# Patient Record
Sex: Male | Born: 1955 | Race: White | Hispanic: No | Marital: Married | Smoking: Never smoker
Health system: Southern US, Community
[De-identification: ages and names within clinical notes are randomized; demographics above are authoritative.]

## PROBLEM LIST (undated history)

## (undated) DIAGNOSIS — E291 Testicular hypofunction: Secondary | ICD-10-CM

## (undated) HISTORY — DX: Testicular hypofunction: E29.1

## (undated) HISTORY — PX: PROSTATE BIOPSY: SHX241

## (undated) HISTORY — PX: LAPAROSCOPIC APPENDECTOMY: SUR753

## (undated) HISTORY — PX: TONSILLECTOMY AND ADENOIDECTOMY: SUR1326

---

## 1999-04-10 ENCOUNTER — Emergency Department (HOSPITAL_COMMUNITY): Admission: EM | Admit: 1999-04-10 | Discharge: 1999-04-10 | Payer: Self-pay | Admitting: Emergency Medicine

## 1999-04-18 ENCOUNTER — Encounter: Payer: Self-pay | Admitting: Family Medicine

## 1999-04-18 ENCOUNTER — Ambulatory Visit (HOSPITAL_COMMUNITY): Admission: RE | Admit: 1999-04-18 | Discharge: 1999-04-18 | Payer: Self-pay | Admitting: Family Medicine

## 1999-09-05 ENCOUNTER — Ambulatory Visit (HOSPITAL_COMMUNITY): Admission: RE | Admit: 1999-09-05 | Discharge: 1999-09-05 | Payer: Self-pay | Admitting: Gastroenterology

## 2000-05-24 ENCOUNTER — Emergency Department (HOSPITAL_COMMUNITY): Admission: EM | Admit: 2000-05-24 | Discharge: 2000-05-24 | Payer: Self-pay | Admitting: Emergency Medicine

## 2000-11-07 ENCOUNTER — Ambulatory Visit (HOSPITAL_COMMUNITY): Admission: RE | Admit: 2000-11-07 | Discharge: 2000-11-07 | Payer: Self-pay | Admitting: Family Medicine

## 2004-02-04 ENCOUNTER — Ambulatory Visit (HOSPITAL_COMMUNITY): Admission: RE | Admit: 2004-02-04 | Discharge: 2004-02-04 | Payer: Self-pay | Admitting: Family Medicine

## 2004-12-28 ENCOUNTER — Encounter: Admission: RE | Admit: 2004-12-28 | Discharge: 2004-12-28 | Payer: Self-pay | Admitting: Family Medicine

## 2009-08-29 ENCOUNTER — Encounter: Admission: RE | Admit: 2009-08-29 | Discharge: 2009-08-29 | Payer: Self-pay | Admitting: Family Medicine

## 2010-07-27 ENCOUNTER — Ambulatory Visit
Admission: RE | Admit: 2010-07-27 | Discharge: 2010-07-27 | Disposition: A | Discharge status: Home / Self Care | Payer: BC Managed Care – PPO | Source: Ambulatory Visit | Attending: Family Medicine | Admitting: Family Medicine

## 2010-07-27 ENCOUNTER — Other Ambulatory Visit: Payer: Self-pay | Admitting: Family Medicine

## 2010-07-27 DIAGNOSIS — IMO0001 Reserved for inherently not codable concepts without codable children: Secondary | ICD-10-CM

## 2011-05-16 ENCOUNTER — Ambulatory Visit (INDEPENDENT_AMBULATORY_CARE_PROVIDER_SITE_OTHER): Payer: BC Managed Care – PPO | Admitting: Family Medicine

## 2011-05-16 VITALS — BP 147/79 | HR 73 | Temp 98.7°F | Resp 16 | Ht 66.5 in | Wt 177.0 lb

## 2011-05-16 DIAGNOSIS — M25579 Pain in unspecified ankle and joints of unspecified foot: Secondary | ICD-10-CM

## 2011-05-16 NOTE — Progress Notes (Signed)
  Subjective:    Patient ID: FAHD GALEA, male    DOB: 10-26-1955, 56 y.o.   MRN: [id number]  HPI 56 yo male with ankle injury today. Was workin gin the yard and rolled ankle.  Was wearing high-ankle work boots.  Able to walk but does hurt some.  History of multiple previous sprains.  Does have brace at home but was not wearing it.    Review of Systems Negative except as per HPI     Objective:   Physical Exam  Constitutional: He appears well-developed.  Pulmonary/Chest: Effort normal.  Neurological: He is alert.    No tenderness over right lateral or medial malleolus.  Pain over ATF ligament.  Good ROM.  Mild swelling over ATF.       Assessment & Plan:  Ankle sprain.  NO xray indicated today.  Use brace for several weeks.  Ice, ibuprofen, ABC exercises.  RTC if not healing as expected or if worsens.

## 2012-04-26 ENCOUNTER — Ambulatory Visit (INDEPENDENT_AMBULATORY_CARE_PROVIDER_SITE_OTHER): Payer: BC Managed Care – PPO | Admitting: Family Medicine

## 2012-04-26 VITALS — BP 118/76 | HR 68 | Temp 98.6°F | Resp 16 | Ht 66.5 in | Wt 176.0 lb

## 2012-04-26 DIAGNOSIS — M25579 Pain in unspecified ankle and joints of unspecified foot: Secondary | ICD-10-CM

## 2012-04-26 MED ORDER — SULFAMETHOXAZOLE-TRIMETHOPRIM 800-160 MG PO TABS: 2.0000 | ORAL_TABLET | Freq: Two times a day (BID) | ORAL | Status: DC

## 2012-04-26 NOTE — Progress Notes (Signed)
  Subjective:    Patient ID: Anthony Schneider, male    DOB: 02-16-1955, 57 y.o.   MRN: [id number]  HPI    Review of Systems     Objective:   Physical Exam  Procedure: consent obtained.  L 5th digit betadine and alcohol prep. 1%plain lidocaine, 0.5 cc injected locally.  #11 blade for "J" incision.  No purulence. Bandaged.      Assessment & Plan:  Salt water soaks tid. Start antibiotics and wound care discussed.

## 2012-04-26 NOTE — Progress Notes (Signed)
  Subjective:    Patient ID: Anthony Schneider, male    DOB: [birth year], 56 y.o.   MRN: [id number]  HPI  Stubbed/broked his toe around xmas, then developed bump and became pink - saw the doctor and was treated with 10d of keflex which improved it but the bump remained and now for the past 3d has started to get very tender, swollen, and red again.  Not draining anything but painful.  Had been doing neosporin which helped a little.  Past Medical History  Diagnosis Date  . Hypogonadism male    Current Outpatient Prescriptions on File Prior to Visit  Medication Sig Dispense Refill  . citalopram (CELEXA) 20 MG tablet Take 20 mg by mouth daily.       No current facility-administered medications on file prior to visit.   No Known Allergies   Review of Systems  Constitutional: Negative for fever, chills, diaphoresis, activity change and appetite change.  Musculoskeletal: Positive for joint swelling and arthralgias. Negative for myalgias, back pain and gait problem.  Skin: Positive for color change. Negative for pallor and wound.  Neurological: Negative for weakness and numbness.  Hematological: Negative for adenopathy. Does not bruise/bleed easily.       BP 118/76  Pulse 68  Temp(Src) 98.6 F (37 C) (Oral)  Resp 16  Ht 5' 6.5" (1.689 m)  Wt 176 lb (79.833 kg)  BMI 27.98 kg/m2  SpO2 96% Objective:   Physical Exam  Constitutional: He is oriented to person, place, and time. He appears well-developed and well-nourished. No distress.  HENT:  Head: Normocephalic and atraumatic.  Eyes: No scleral icterus.  Pulmonary/Chest: Effort normal.  Neurological: He is alert and oriented to person, place, and time.  Skin: Skin is warm and dry. He is not diaphoretic.  Left 5th toe with blanchable erythema over distal phalanx with localized induration with small amount of fluctuance vs edema on dorsal medial aspect near nail bed.  Psychiatric: He has a normal mood and affect. His behavior is normal.       Assessment & Plan:  Paronychia of fifth toe of left foot  Failed oral antibiotic therapy alone so will need drainage today.  Asked McClung, PA to examine toe and drain.  Try course of bactrim with frequent soaks and top antibiotics. Meds ordered this encounter  Medications  . clomiPHENE (CLOMID) 50 MG tablet    Sig: Take 50 mg by mouth daily.  Marland Kitchen sulfamethoxazole-trimethoprim (BACTRIM DS,SEPTRA DS) 800-160 MG per tablet    Sig: Take 2 tablets by mouth 2 (two) times daily.    Dispense:  40 tablet    Refill:  0

## 2012-04-26 NOTE — Patient Instructions (Addendum)
Paronychia  Paronychia is an inflammatory reaction involving the folds of the skin surrounding the fingernail. This is commonly caused by an infection in the skin around a nail. The most common cause of paronychia is frequent wetting of the hands (as seen with bartenders, food servers, nurses or others who wet their hands). This makes the skin around the fingernail susceptible to infection by bacteria (germs) or fungus. Other predisposing factors are:   Aggressive manicuring.   Nail biting.   Thumb sucking.  The most common cause is a staphylococcal (a type of germ) infection, or a fungal (Candida) infection. When caused by a germ, it usually comes on suddenly with redness, swelling, pus and is often painful. It may get under the nail and form an abscess (collection of pus), or form an abscess around the nail. If the nail itself is infected with a fungus, the treatment is usually prolonged and may require oral medicine for up to one year. Your caregiver will determine the length of time treatment is required. The paronychia caused by bacteria (germs) may largely be avoided by not pulling on hangnails or picking at cuticles. When the infection occurs at the tips of the finger it is called felon. When the cause of paronychia is from the herpes simplex virus (HSV) it is called herpetic whitlow.  TREATMENT   When an abscess is present treatment is often incision and drainage. This means that the abscess must be cut open so the pus can get out. When this is done, the following home care instructions should be followed.  HOME CARE INSTRUCTIONS    It is important to keep the affected fingers very dry. Rubber or plastic gloves over cotton gloves should be used whenever the hand must be placed in water.   Keep wound clean, dry and dressed as suggested by your caregiver between warm soaks or warm compresses.   Soak in warm water for fifteen to twenty minutes three to four times per day for bacterial infections. Fungal  infections are very difficult to treat, so often require treatment for long periods of time.   For bacterial (germ) infections take antibiotics (medicine which kill germs) as directed and finish the prescription, even if the problem appears to be solved before the medicine is gone.   Only take over-the-counter or prescription medicines for pain, discomfort, or fever as directed by your caregiver.  SEEK IMMEDIATE MEDICAL CARE IF:   You have redness, swelling, or increasing pain in the wound.   You notice pus coming from the wound.   You have a fever.   You notice a bad smell coming from the wound or dressing.  Document Released: 07/24/2000 Document Revised: 04/22/2011 Document Reviewed: 03/25/2008  [employer] Patient Information 2013 [employer].

## 2012-05-04 ENCOUNTER — Other Ambulatory Visit: Payer: Self-pay

## 2012-05-04 ENCOUNTER — Ambulatory Visit
Admission: RE | Admit: 2012-05-04 | Discharge: 2012-05-04 | Disposition: A | Discharge status: Home / Self Care | Payer: BC Managed Care – PPO | Source: Ambulatory Visit

## 2012-05-04 DIAGNOSIS — M79675 Pain in left toe(s): Secondary | ICD-10-CM

## 2012-07-03 ENCOUNTER — Other Ambulatory Visit: Payer: Self-pay | Admitting: Family Medicine

## 2012-07-03 ENCOUNTER — Ambulatory Visit
Admission: RE | Admit: 2012-07-03 | Discharge: 2012-07-03 | Disposition: A | Discharge status: Home / Self Care | Payer: BC Managed Care – PPO | Source: Ambulatory Visit | Attending: Family Medicine | Admitting: Family Medicine

## 2012-07-03 DIAGNOSIS — M545 Low back pain: Secondary | ICD-10-CM

## 2013-04-01 ENCOUNTER — Emergency Department (HOSPITAL_COMMUNITY)
Admission: EM | Admit: 2013-04-01 | Discharge: 2013-04-02 | Disposition: A | Discharge status: Home / Self Care | Payer: BC Managed Care – PPO | Attending: Urology | Admitting: Urology

## 2013-04-01 ENCOUNTER — Encounter (HOSPITAL_COMMUNITY): Payer: Self-pay | Admitting: Emergency Medicine

## 2013-04-01 DIAGNOSIS — Z8639 Personal history of other endocrine, nutritional and metabolic disease: Secondary | ICD-10-CM | POA: Insufficient documentation

## 2013-04-01 DIAGNOSIS — R110 Nausea: Secondary | ICD-10-CM | POA: Insufficient documentation

## 2013-04-01 DIAGNOSIS — Z79899 Other long term (current) drug therapy: Secondary | ICD-10-CM | POA: Insufficient documentation

## 2013-04-01 DIAGNOSIS — N390 Urinary tract infection, site not specified: Secondary | ICD-10-CM

## 2013-04-01 DIAGNOSIS — Z792 Long term (current) use of antibiotics: Secondary | ICD-10-CM | POA: Insufficient documentation

## 2013-04-01 DIAGNOSIS — A419 Sepsis, unspecified organism: Secondary | ICD-10-CM | POA: Diagnosis present

## 2013-04-01 DIAGNOSIS — N419 Inflammatory disease of prostate, unspecified: Secondary | ICD-10-CM | POA: Insufficient documentation

## 2013-04-01 DIAGNOSIS — Z862 Personal history of diseases of the blood and blood-forming organs and certain disorders involving the immune mechanism: Secondary | ICD-10-CM | POA: Insufficient documentation

## 2013-04-01 DIAGNOSIS — Z9889 Other specified postprocedural states: Secondary | ICD-10-CM | POA: Insufficient documentation

## 2013-04-01 NOTE — ED Notes (Signed)
Pt reports onset of fever today of 100.5 at home approx 95min pta w/ chills that began approx 2hrs ago - pt states he awoke this a.m. Not feeling well. Pt had a prostate biopsy performed on Tuesday - pt has also been experiencing urinary urgency and frequency. Pt also reports lower back pain, which pt has a hx of however pt reports increase in lower back pain recently. Pt last took tylenol approx 1600 today.

## 2013-04-02 ENCOUNTER — Encounter (HOSPITAL_COMMUNITY): Payer: Self-pay | Admitting: *Deleted

## 2013-04-02 ENCOUNTER — Inpatient Hospital Stay (HOSPITAL_COMMUNITY)
Admission: AD | Admit: 2013-04-02 | Discharge: 2013-04-05 | DRG: 872 | Disposition: A | Discharge status: Home / Self Care | Payer: BC Managed Care – PPO | Source: Ambulatory Visit | Attending: Urology | Admitting: Urology

## 2013-04-02 ENCOUNTER — Emergency Department (HOSPITAL_COMMUNITY): Payer: BC Managed Care – PPO

## 2013-04-02 DIAGNOSIS — R61 Generalized hyperhidrosis: Secondary | ICD-10-CM | POA: Diagnosis present

## 2013-04-02 DIAGNOSIS — M545 Low back pain, unspecified: Secondary | ICD-10-CM | POA: Diagnosis present

## 2013-04-02 DIAGNOSIS — R310 Gross hematuria: Secondary | ICD-10-CM | POA: Diagnosis present

## 2013-04-02 DIAGNOSIS — N390 Urinary tract infection, site not specified: Secondary | ICD-10-CM | POA: Diagnosis present

## 2013-04-02 DIAGNOSIS — Z9889 Other specified postprocedural states: Secondary | ICD-10-CM

## 2013-04-02 DIAGNOSIS — R3915 Urgency of urination: Secondary | ICD-10-CM | POA: Diagnosis present

## 2013-04-02 DIAGNOSIS — A419 Sepsis, unspecified organism: Principal | ICD-10-CM | POA: Diagnosis present

## 2013-04-02 DIAGNOSIS — R51 Headache: Secondary | ICD-10-CM | POA: Diagnosis present

## 2013-04-02 DIAGNOSIS — R3911 Hesitancy of micturition: Secondary | ICD-10-CM | POA: Diagnosis present

## 2013-04-02 LAB — URINALYSIS, ROUTINE W REFLEX MICROSCOPIC
Bilirubin Urine: NEGATIVE
Glucose, UA: NEGATIVE mg/dL
Glucose, UA: NEGATIVE mg/dL
Ketones, ur: 15 mg/dL — AB
Ketones, ur: NEGATIVE mg/dL
NITRITE: NEGATIVE
NITRITE: POSITIVE — AB
PH: 6.5 (ref 5.0–8.0)
Protein, ur: 100 mg/dL — AB
Protein, ur: NEGATIVE mg/dL
SPECIFIC GRAVITY, URINE: 1.014 (ref 1.005–1.030)
SPECIFIC GRAVITY, URINE: 1.03 (ref 1.005–1.030)
UROBILINOGEN UA: 1 mg/dL (ref 0.0–1.0)
UROBILINOGEN UA: 1 mg/dL (ref 0.0–1.0)
pH: 6.5 (ref 5.0–8.0)

## 2013-04-02 LAB — COMPREHENSIVE METABOLIC PANEL
ALT: 42 U/L (ref 0–53)
AST: 29 U/L (ref 0–37)
Albumin: 3.5 g/dL (ref 3.5–5.2)
Alkaline Phosphatase: 61 U/L (ref 39–117)
BUN: 17 mg/dL (ref 6–23)
CO2: 22 meq/L (ref 19–32)
Calcium: 8.8 mg/dL (ref 8.4–10.5)
Chloride: 96 mEq/L (ref 96–112)
Creatinine, Ser: 1.02 mg/dL (ref 0.50–1.35)
GFR calc non Af Amer: 80 mL/min — ABNORMAL LOW (ref >90)
Glucose, Bld: 133 mg/dL — ABNORMAL HIGH (ref 70–99)
Potassium: 3.8 mEq/L (ref 3.7–5.3)
SODIUM: 133 meq/L — AB (ref 137–147)
TOTAL PROTEIN: 6.7 g/dL (ref 6.0–8.3)
Total Bilirubin: 2.6 mg/dL — ABNORMAL HIGH (ref 0.3–1.2)

## 2013-04-02 LAB — CBC WITH DIFFERENTIAL/PLATELET
BASOS PCT: 0 % (ref 0–1)
Basophils Absolute: 0 10*3/uL (ref 0.0–0.1)
Eosinophils Absolute: 0 10*3/uL (ref 0.0–0.7)
Eosinophils Relative: 0 % (ref 0–5)
HEMATOCRIT: 41 % (ref 39.0–52.0)
Hemoglobin: 14.5 g/dL (ref 13.0–17.0)
LYMPHS PCT: 1 % — AB (ref 12–46)
Lymphs Abs: 0.2 10*3/uL — ABNORMAL LOW (ref 0.7–4.0)
MCH: 31.3 pg (ref 26.0–34.0)
MCHC: 35.4 g/dL (ref 30.0–36.0)
MCV: 88.6 fL (ref 78.0–100.0)
Monocytes Absolute: 0.5 10*3/uL (ref 0.1–1.0)
Monocytes Relative: 4 % (ref 3–12)
NEUTROS ABS: 13.4 10*3/uL — AB (ref 1.7–7.7)
Neutrophils Relative %: 95 % — ABNORMAL HIGH (ref 43–77)
PLATELETS: 143 10*3/uL — AB (ref 150–400)
RBC: 4.63 MIL/uL (ref 4.22–5.81)
RDW: 12.9 % (ref 11.5–15.5)
WBC: 14.1 10*3/uL — AB (ref 4.0–10.5)

## 2013-04-02 LAB — URINE MICROSCOPIC-ADD ON

## 2013-04-02 MED ORDER — ONDANSETRON HCL 4 MG/2ML IJ SOLN
4.0000 mg | Freq: Once | INTRAMUSCULAR | Status: AC
  Administered 2013-04-02: 4 mg via INTRAVENOUS
  Filled 2013-04-02: qty 2

## 2013-04-02 MED ORDER — ZOLPIDEM TARTRATE 5 MG PO TABS
5.0000 mg | ORAL_TABLET | Freq: Every evening | ORAL | Status: DC | PRN
  Administered 2013-04-04 (×2): 5 mg via ORAL
  Filled 2013-04-02 (×2): qty 1

## 2013-04-02 MED ORDER — DEXTROSE-NACL 5-0.45 % IV SOLN: INTRAVENOUS | Status: AC

## 2013-04-02 MED ORDER — PIPERACILLIN-TAZOBACTAM 3.375 G IVPB
3.3750 g | Freq: Three times a day (TID) | INTRAVENOUS | Status: DC
  Administered 2013-04-02 – 2013-04-05 (×9): 3.375 g via INTRAVENOUS
  Filled 2013-04-02 (×10): qty 50

## 2013-04-02 MED ORDER — DEXTROSE-NACL 5-0.45 % IV SOLN
INTRAVENOUS | Status: DC
  Administered 2013-04-02 – 2013-04-04 (×5): via INTRAVENOUS

## 2013-04-02 MED ORDER — ACETAMINOPHEN 325 MG PO TABS: 650.0000 mg | ORAL_TABLET | ORAL | Status: DC | PRN

## 2013-04-02 MED ORDER — ZOLPIDEM TARTRATE 5 MG PO TABS: 5.0000 mg | ORAL_TABLET | Freq: Every evening | ORAL | Status: DC | PRN

## 2013-04-02 MED ORDER — IBUPROFEN 800 MG PO TABS
800.0000 mg | ORAL_TABLET | Freq: Once | ORAL | Status: AC
  Administered 2013-04-02: 800 mg via ORAL
  Filled 2013-04-02: qty 1

## 2013-04-02 MED ORDER — DOXYCYCLINE HYCLATE 100 MG PO CAPS: 100.0000 mg | ORAL_CAPSULE | Freq: Two times a day (BID) | ORAL | Status: DC

## 2013-04-02 MED ORDER — CVS PROBIOTIC (LACTOBACILLUS) PO CAPS: 1.0000 | ORAL_CAPSULE | Freq: Every day | ORAL | Status: AC

## 2013-04-02 MED ORDER — DEXTROSE 5 % IV SOLN
2.0000 g | Freq: Once | INTRAVENOUS | Status: AC
  Administered 2013-04-02: 2 g via INTRAVENOUS
  Filled 2013-04-02: qty 2

## 2013-04-02 MED ORDER — MORPHINE SULFATE 4 MG/ML IJ SOLN
4.0000 mg | Freq: Once | INTRAMUSCULAR | Status: AC
  Administered 2013-04-02: 4 mg via INTRAVENOUS
  Filled 2013-04-02: qty 1

## 2013-04-02 MED ORDER — ACETAMINOPHEN 325 MG PO TABS
650.0000 mg | ORAL_TABLET | ORAL | Status: DC | PRN
  Administered 2013-04-03 – 2013-04-04 (×2): 650 mg via ORAL
  Filled 2013-04-02 (×2): qty 2

## 2013-04-02 MED ORDER — ONDANSETRON 4 MG PO TBDP: 4.0000 mg | ORAL_TABLET | Freq: Three times a day (TID) | ORAL | Status: AC | PRN

## 2013-04-02 MED ORDER — DEXTROSE 5 % IV SOLN
7.0000 mg/kg | INTRAVENOUS | Status: DC
  Administered 2013-04-02 – 2013-04-04 (×3): 560 mg via INTRAVENOUS
  Filled 2013-04-02 (×4): qty 14

## 2013-04-02 MED ORDER — CEPHALEXIN 500 MG PO CAPS: 500.0000 mg | ORAL_CAPSULE | Freq: Four times a day (QID) | ORAL | Status: DC

## 2013-04-02 MED ORDER — ACETAMINOPHEN 325 MG PO TABS
650.0000 mg | ORAL_TABLET | Freq: Once | ORAL | Status: AC
  Administered 2013-04-02: 650 mg via ORAL
  Filled 2013-04-02: qty 2

## 2013-04-02 MED ORDER — PIPERACILLIN-TAZOBACTAM 3.375 G IVPB: 3.3750 g | Freq: Three times a day (TID) | INTRAVENOUS | Status: DC

## 2013-04-02 MED ORDER — OXYCODONE-ACETAMINOPHEN 5-325 MG PO TABS: 1.0000 | ORAL_TABLET | Freq: Four times a day (QID) | ORAL | Status: DC | PRN

## 2013-04-02 MED ORDER — ONDANSETRON HCL 4 MG/2ML IJ SOLN
4.0000 mg | INTRAMUSCULAR | Status: DC | PRN
Start: 2013-04-02 — End: 2013-04-02

## 2013-04-02 MED ORDER — HYDROCODONE-ACETAMINOPHEN 5-325 MG PO TABS
1.0000 | ORAL_TABLET | ORAL | Status: DC | PRN
  Administered 2013-04-02 (×2): 2 via ORAL
  Administered 2013-04-03 (×2): 1 via ORAL
  Administered 2013-04-03: 2 via ORAL
  Administered 2013-04-04 (×3): 1 via ORAL
  Filled 2013-04-02 (×2): qty 1
  Filled 2013-04-02 (×2): qty 2
  Filled 2013-04-02 (×2): qty 1
  Filled 2013-04-02: qty 2
  Filled 2013-04-02: qty 1
  Filled 2013-04-02: qty 2

## 2013-04-02 MED ORDER — ONDANSETRON HCL 4 MG/2ML IJ SOLN
4.0000 mg | INTRAMUSCULAR | Status: DC | PRN
Start: 2013-04-02 — End: 2013-04-05
  Administered 2013-04-04: 4 mg via INTRAVENOUS
  Filled 2013-04-02: qty 2

## 2013-04-02 MED ORDER — SODIUM CHLORIDE 0.9 % IV BOLUS (SEPSIS)
1000.0000 mL | Freq: Once | INTRAVENOUS | Status: AC
  Administered 2013-04-02: 1000 mL via INTRAVENOUS

## 2013-04-02 MED ORDER — HYDROCODONE-ACETAMINOPHEN 5-325 MG PO TABS: 1.0000 | ORAL_TABLET | ORAL | Status: DC | PRN

## 2013-04-02 NOTE — Progress Notes (Signed)
Pt arrived as direct admit to floor from home. Pt oriented to callbell and environment. VSS w/ temp of 100.6 and HR 110. Dr Janice Norrie aware of pt's arrival and entering orders in Epic now.

## 2013-04-02 NOTE — H&P (Signed)
Anthony Schneider is an 58 y.o. male.    HPI: Mr Schollmeyer had a prostate biopsy on 2/17.  He was doing well till yesterday when he started having chills, fever, low back pain.  His temperature was 100.5.  He was seen in the ER and was given Rocephin.  He was discharged home on doxycycline, Keflex and ibuprofen.  He was doing well until this afternoon when his temperature spiked up to 103.  He called the office and I advised him to come to the hospital for admission and further evaluation and IV antibiotics.  Past Medical History  Diagnosis Date  . Hypogonadism male     Past Surgical History  Procedure Laterality Date  . Tonsillectomy and adenoidectomy    . Prostate biopsy      Medications Prior to Admission  Medication Sig Dispense Refill  . acetaminophen (TYLENOL) 500 MG tablet Take 500 mg by mouth every 6 (six) hours as needed (pain.).       Marland Kitchen cephALEXin (KEFLEX) 500 MG capsule Take 1 capsule (500 mg total) by mouth 4 (four) times daily.  40 capsule  0  . citalopram (CELEXA) 20 MG tablet Take 20 mg by mouth daily.      Marland Kitchen doxycycline (VIBRAMYCIN) 100 MG capsule Take 1 capsule (100 mg total) by mouth 2 (two) times daily.  20 capsule  0  . Lactobacillus Rhamnosus, GG, ([employer] PROBIOTIC, LACTOBACILLUS,) CAPS Take 1 tablet by mouth daily.  30 capsule  0  . oxyCODONE-acetaminophen (PERCOCET) 5-325 MG per tablet Take 1-2 tablets by mouth every 6 (six) hours as needed for severe pain.  20 tablet  0  . levofloxacin (LEVAQUIN) 500 MG tablet Take 500 mg by mouth daily.      . ondansetron (ZOFRAN ODT) 4 MG disintegrating tablet Take 1 tablet (4 mg total) by mouth every 8 (eight) hours as needed for nausea or vomiting.  10 tablet  0    Allergies: No Known Allergies  History reviewed. No pertinent family history.  Social History:  reports that he has never smoked. He has never used smokeless tobacco. He reports that he drinks alcohol. He reports that he does not use illicit drugs.  Review of  Systems: Pertinent items are noted in HPI. A comprehensive review of systems was negative except for: frequency, urgency, dysuria, fever, chills, anorexia.  Results for orders placed during the hospital encounter of 04/02/13 (from the past 48 hour(s))  URINALYSIS, ROUTINE W REFLEX MICROSCOPIC     Status: Abnormal   Collection Time    04/02/13  4:27 PM      Result Value Ref Range   Color, Urine ORANGE (*) YELLOW   Comment: BIOCHEMICALS MAY BE AFFECTED BY COLOR   APPearance CLOUDY (*) CLEAR   Specific Gravity, Urine 1.030  1.005 - 1.030   pH 6.5  5.0 - 8.0   Glucose, UA NEGATIVE  NEGATIVE mg/dL   Hgb urine dipstick LARGE (*) NEGATIVE   Bilirubin Urine SMALL (*) NEGATIVE   Ketones, ur 15 (*) NEGATIVE mg/dL   Protein, ur 100 (*) NEGATIVE mg/dL   Urobilinogen, UA 1.0  0.0 - 1.0 mg/dL   Nitrite POSITIVE (*) NEGATIVE   Leukocytes, UA MODERATE (*) NEGATIVE  URINE MICROSCOPIC-ADD ON     Status: Abnormal   Collection Time    04/02/13  4:27 PM      Result Value Ref Range   Squamous Epithelial / LPF RARE  RARE   WBC, UA 11-20  <3 WBC/hpf  RBC / HPF 7-10  <3 RBC/hpf   Bacteria, UA FEW (*) RARE   Casts HYALINE CASTS (*) NEGATIVE  CBC WITH DIFFERENTIAL     Status: Abnormal   Collection Time    04/02/13  4:40 PM      Result Value Ref Range   WBC 14.1 (*) 4.0 - 10.5 K/uL   RBC 4.63  4.22 - 5.81 MIL/uL   Hemoglobin 14.5  13.0 - 17.0 g/dL   HCT 41.0  39.0 - 52.0 %   MCV 88.6  78.0 - 100.0 fL   MCH 31.3  26.0 - 34.0 pg   MCHC 35.4  30.0 - 36.0 g/dL   RDW 12.9  11.5 - 15.5 %   Platelets 143 (*) 150 - 400 K/uL   Neutrophils Relative % 95 (*) 43 - 77 %   Neutro Abs 13.4 (*) 1.7 - 7.7 K/uL   Lymphocytes Relative 1 (*) 12 - 46 %   Lymphs Abs 0.2 (*) 0.7 - 4.0 K/uL   Monocytes Relative 4  3 - 12 %   Monocytes Absolute 0.5  0.1 - 1.0 K/uL   Eosinophils Relative 0  0 - 5 %   Eosinophils Absolute 0.0  0.0 - 0.7 K/uL   Basophils Relative 0  0 - 1 %   Basophils Absolute 0.0  0.0 - 0.1 K/uL     Dg Chest 2 View  04/02/2013   CLINICAL DATA:  Fever, flu-like symptoms.  EXAM: CHEST  2 VIEW  COMPARISON:  None.  FINDINGS: Heart is normal size. There is prominence in the left hilum and AP window region. Cannot exclude adenopathy. No confluent airspace on the right. Minimal predominately linear densities in the left base, likely atelectasis. No effusions. No acute bony abnormality.  IMPRESSION: Fullness in the left hilum and AP window region. Cannot exclude adenopathy. This could be further evaluated with chest CT with IV contrast.  Minimal left base density, likely atelectasis.   Electronically Signed   By: Rolm Baptise M.D.   On: 04/02/2013 00:35    Temp:  [98.9 F (37.2 C)-102.9 F (39.4 C)] 100.6 F (38.1 C) (02/20 1521) Pulse Rate:  [80-110] 110 (02/20 1521) Resp:  [16-20] 20 (02/20 1521) BP: (92-161)/(51-88) 133/81 mmHg (02/20 1521) SpO2:  [92 %-100 %] 96 % (02/20 1521) Weight:  [77.111 kg (170 lb)-80.74 kg (178 lb)] 80.74 kg (178 lb) (02/20 1521)  Physical Exam: General appearance: alert and appears stated age Head: Normocephalic, without obvious abnormality, atraumatic Eyes: conjunctivae/corneas clear. EOM's intact.  Oropharynx: moist mucous membranes Neck: supple, symmetrical, trachea midline Resp: normal respiratory effort Cardio: regular rate and rhythm Back: symmetric, no curvature. ROM normal. No CVA tenderness. GI: soft, non-tender; bowel sounds normal; no masses,  no organomegaly Male genitalia: penis: normal male phallus with no lesions or discharge.Testes: bilaterally descended with no masses or tenderness. no hernias Pelvic: deferred Extremities: extremities normal, atraumatic, no cyanosis or edema Skin: Skin color normal. No visible rashes or lesions Neurologic: Grossly normal  Assessment/Plan Urosepsis. S/P prostate biopsy  Plan: Admit for IV antibiotics after blood and urine cultures.  Hanley Ben 04/02/2013, 5:47 PM

## 2013-04-02 NOTE — ED Provider Notes (Signed)
CSN: [id number]     Arrival date & time 04/01/13  2335 History   First MD Initiated Contact with Patient 04/02/13 0141     Chief Complaint  Patient presents with  . Fever     (Consider location/radiation/quality/duration/timing/severity/associated sxs/prior Treatment) HPI Comments: The patient is a 58 year old male past medical history significant for hypogonadism presented to the emergency department for fever (TMAX 100.67F), chills, and moderate to severe low back pain with associated urinary urgency that began 30 minutes prior to arrival. Patient states he had a prostate biopsy on Tuesday performed by Dr. Kellie Simmering. Patient states he was given Levaquin for three days starting the day before the procedure. Patient took Tylenol at 4PM today. Patient states he was advised in his discharge instructions to come to the ED for any fevers. Denies any abdominal surgical history.    Past Medical History  Diagnosis Date  . Hypogonadism male    Past Surgical History  Procedure Laterality Date  . Tonsillectomy and adenoidectomy    . Prostate biopsy     History reviewed. No pertinent family history. History  Substance Use Topics  . Smoking status: Never Smoker   . Smokeless tobacco: Not on file  . Alcohol Use: Yes     Comment: occasionally    Review of Systems  Constitutional: Positive for fever.  Gastrointestinal: Positive for nausea. Negative for vomiting, abdominal pain and rectal pain.  Genitourinary: Positive for urgency. Negative for dysuria, frequency, hematuria, decreased urine volume, discharge, penile swelling, scrotal swelling, penile pain and testicular pain.  Musculoskeletal: Positive for back pain.  All other systems reviewed and are negative.      Allergies  Review of patient's allergies indicates no known allergies.  Home Medications   Current Outpatient Rx  Name  Route  Sig  Dispense  Refill  . acetaminophen (TYLENOL) 500 MG tablet   Oral   Take 500 mg by mouth  every 6 (six) hours as needed.         . citalopram (CELEXA) 20 MG tablet   Oral   Take 20 mg by mouth daily.         . cephALEXin (KEFLEX) 500 MG capsule   Oral   Take 1 capsule (500 mg total) by mouth 4 (four) times daily.   40 capsule   0   . doxycycline (VIBRAMYCIN) 100 MG capsule   Oral   Take 1 capsule (100 mg total) by mouth 2 (two) times daily.   20 capsule   0   . Lactobacillus Rhamnosus, GG, ([employer] PROBIOTIC, LACTOBACILLUS,) CAPS   Oral   Take 1 tablet by mouth daily.   30 capsule   0   . levofloxacin (LEVAQUIN) 500 MG tablet   Oral   Take 500 mg by mouth daily.         . ondansetron (ZOFRAN ODT) 4 MG disintegrating tablet   Oral   Take 1 tablet (4 mg total) by mouth every 8 (eight) hours as needed for nausea or vomiting.   10 tablet   0   . oxyCODONE-acetaminophen (PERCOCET) 5-325 MG per tablet   Oral   Take 1-2 tablets by mouth every 6 (six) hours as needed for severe pain.   20 tablet   0    BP 96/52  Pulse 80  Temp(Src) 98.9 F (37.2 C) (Oral)  Resp 16  Ht 5\' 7"  (1.702 m)  Wt 170 lb (77.111 kg)  BMI 26.62 kg/m2  SpO2 95% Physical Exam  Constitutional: He  is oriented to person, place, and time. He appears well-developed and well-nourished. No distress.  HENT:  Head: Normocephalic and atraumatic.  Right Ear: External ear normal.  Left Ear: External ear normal.  Nose: Nose normal.  Mouth/Throat: Oropharynx is clear and moist. No oropharyngeal exudate.  Eyes: Conjunctivae are normal.  Neck: Normal range of motion. Neck supple.  Cardiovascular: Normal rate, regular rhythm and normal heart sounds.   Pulmonary/Chest: Effort normal and breath sounds normal. No respiratory distress. He exhibits no tenderness.  Abdominal: Soft. Bowel sounds are normal. He exhibits no distension. There is no tenderness. There is no rigidity, no rebound, no guarding and no CVA tenderness.  Genitourinary:  DRE was deferred d/t recent biopsy.   Musculoskeletal:  Normal range of motion.       Lumbar back: He exhibits tenderness. He exhibits normal range of motion, no bony tenderness, no swelling, no edema, no deformity, no laceration, no pain, no spasm and normal pulse.       Back:  Neurological: He is alert and oriented to person, place, and time.  Skin: Skin is warm and dry. He is not diaphoretic.  Psychiatric: He has a normal mood and affect.    ED Course  Procedures (including critical care time) Medications  acetaminophen (TYLENOL) tablet 650 mg (650 mg Oral Given 04/02/13 0155)  sodium chloride 0.9 % bolus 1,000 mL (0 mLs Intravenous Stopped 04/02/13 0416)  morphine 4 MG/ML injection 4 mg (4 mg Intravenous Given 04/02/13 0225)  ondansetron (ZOFRAN) injection 4 mg (4 mg Intravenous Given 04/02/13 0225)  cefTRIAXone (ROCEPHIN) 2 g in dextrose 5 % 50 mL IVPB (0 g Intravenous Stopped 04/02/13 0416)  ibuprofen (ADVIL,MOTRIN) tablet 800 mg (800 mg Oral Given 04/02/13 0248)    Labs Review Labs Reviewed  URINALYSIS, ROUTINE W REFLEX MICROSCOPIC - Abnormal; Notable for the following:    Hgb urine dipstick MODERATE (*)    Leukocytes, UA TRACE (*)    All other components within normal limits  URINE MICROSCOPIC-ADD ON   Imaging Review Dg Chest 2 View  04/02/2013   CLINICAL DATA:  Fever, flu-like symptoms.  EXAM: CHEST  2 VIEW  COMPARISON:  None.  FINDINGS: Heart is normal size. There is prominence in the left hilum and AP window region. Cannot exclude adenopathy. No confluent airspace on the right. Minimal predominately linear densities in the left base, likely atelectasis. No effusions. No acute bony abnormality.  IMPRESSION: Fullness in the left hilum and AP window region. Cannot exclude adenopathy. This could be further evaluated with chest CT with IV contrast.  Minimal left base density, likely atelectasis.   Electronically Signed   By: Charlett Nose M.D.   On: 04/02/2013 00:35    EKG Interpretation   None       MDM   Final diagnoses:    Prostatitis    Filed Vitals:   04/02/13 0439  BP: 96/52  Pulse: 80  Temp: 98.9 F (37.2 C)  Resp: 16   Patient initially afebrile in emergency department upon presentation with low back pain, myalgias, chills 2 days status post prostate biopsy. The patient spiked a fever in emergency department. Tylenol and Motrin given with successful reduction of fever. UA reviewed. Hemoglobin likely due to trauma from prostate biopsy. Fever and symptoms likely due to prostatitis from recent prostate biopsy. Will give IV fluids, antibiotics, pain medication and nausea medication in the ER. I have reviewed nursing notes, vital signs, and all appropriate lab and imaging results for this patient. Patient's  symptoms have improved. Will discharge patient home on Keflex and doxycycline for broad-spectrum coverage. We'll also provide pain medication and nausea medication. Advised patient to followup with his urologist today. Patient is agreeable to plan. Patient is stable at time of discharge. Patient d/w with Dr. Randal Buba, agrees with plan.          Harlow Mares, PA-C 04/02/13 [phone]

## 2013-04-02 NOTE — Progress Notes (Signed)
ANTIBIOTIC CONSULT NOTE - INITIAL  Pharmacy Consult for gentamicin Indication: urosepsis  No Known Allergies  Patient Measurements: Height: 5\' 7"  (170.2 cm) Weight: 178 lb (80.74 kg) IBW/kg (Calculated) : 66.1  Vital Signs: Temp: 100.6 F (38.1 C) (02/20 1521) Temp src: Oral (02/20 1521) BP: 133/81 mmHg (02/20 1521) Pulse Rate: 110 (02/20 1521) Intake/Output from previous day:   Intake/Output from this shift: Total I/O In: 290 [P.O.:240; IV Piggyback:50] Out: -   Labs:  Recent Labs  04/02/13 1640  WBC 14.1*  HGB 14.5  PLT 143*  CREATININE 1.02   Estimated Creatinine Clearance: 82.2 ml/min (by C-G formula based on Cr of 1.02). No results found for this basename: VANCOTROUGH, VANCOPEAK, VANCORANDOM, GENTTROUGH, GENTPEAK, GENTRANDOM, TOBRATROUGH, TOBRAPEAK, TOBRARND, AMIKACINPEAK, AMIKACINTROU, AMIKACIN,  in the last 72 hours   Microbiology: No results found for this or any previous visit (from the past 720 hour(s)).  Medical History: Past Medical History  Diagnosis Date  . Hypogonadism male     Medications:  Scheduled:  . piperacillin-tazobactam (ZOSYN)  IV  3.375 g Intravenous 3 times per day   Infusions:  . dextrose 5 % and 0.45% NaCl 125 mL/hr at 04/02/13 1607   Assessment: 58 yo male presented to ER with fever, chills, low back pain and associated urinary urgency. Patient just had prostate biopsy on Tuesday and was given 3 days of Levaquin prior to procedure. Now to start broad spectrum abx's with gentamicin per pharmacy dosing and Zosyn per Md dosing for urosepsis  Goal of Therapy:  Gentamicin adjustment per [place] [employer]  Plan:  1) Gentamicin 7mg /kg (560mg ) IV q24 2) Check random gentamicin level 10 hrs after gentamicin dose started 3) ?need for gentamicin and Zosyn both. Recommend d/c gentamicin if possible   Adrian Saran, PharmD, BCPS Pager [phone] 04/02/2013 6:19 PM

## 2013-04-02 NOTE — ED Notes (Signed)
Pt ambulating independently w/ steady gait on d/c in no acute distress, A&Ox4. D/c instructions reviewed w/ pt and family - pt and family deny any further questions or concerns at present. Rx given x2  

## 2013-04-02 NOTE — Discharge Instructions (Signed)
Please follow up with your urologist to schedule a follow up appointment. Please take your antibiotic until completion. Please take pain medication as prescribed and as needed for pain. Please do not drive on narcotic pain medication. Please take Zofran and Probiotic as prescribed. Please read all discharge instructions and return precautions.    Prostatitis The prostate gland is about the size and shape of a walnut. It is located just below your bladder. It produces one of the components of semen, which is made up of sperm and the fluids that help nourish and transport it out from the testicles. Prostatitis is inflammation of the prostate gland.  There are four types of prostatitis:  Acute bacterial prostatitis This is the least common type of prostatitis. It starts quickly and usually is associated with a bladder infection, high fever, and shaking chills. It can occur at any age.  Chronic bacterial prostatitis This is a persistent bacterial infection in the prostate. It usually develops from repeated acute bacterial prostatitis or acute bacterial prostatitis that was not properly treated. It can occur in men of any age but is most common in middle-aged men whose prostate has begun to enlarge. The symptoms are not as severe as those in acute bacterial prostatitis. Discomfort in the part of your body that is in front of your rectum and below your scrotum (perineum), lower abdomen, or in the head of your penis (glans) may represent your primary discomfort.  Chronic prostatitis (nonbacterial) This is the most common type of prostatitis. It is inflammation of the prostate gland that is not caused by a bacterial infection. The cause is unknown and may be associated with a viral infection or autoimmune disorder.  Prostatodynia (pelvic floor disorder) This is associated with increased muscular tone in the pelvis surrounding the prostate. CAUSES The causes of bacterial prostatitis are bacterial infection.  The causes of the other types of prostatitis are unknown.  SYMPTOMS  Symptoms can vary depending upon the type of prostatitis that exists. There can also be overlap in symptoms. Possible symptoms for each type of prostatitis are listed below. Acute Bacterial Prostatitis  Painful urination.  Fever or chills.  Muscle or joint pains.  Low back pain.  Low abdominal pain.  Inability to empty bladder completely. Chronic Bacterial Prostatitis, Chronic Nonbacterial Prostatitis, and Prostatodynia  Sudden urge to urinate.  Frequent urination.  Difficulty starting urine stream.  Weak urine stream.  Discharge from the urethra.  Dribbling after urination.  Rectal pain.  Pain in the testicles, penis, or tip of the penis.  Pain in the perineum.  Problems with sexual function.  Painful ejaculation.  Bloody semen. DIAGNOSIS  In order to diagnose prostatitis, your health care provider will ask about your symptoms. One or more urine samples will be taken and tested (urinalysis). If the urinalysis result is negative for bacteria, your health care provider may use a finger to feel your prostate (digital rectal exam). This exam helps your health care provider determine if your prostate is swollen and tender. It will also produce a specimen of semen that can be analyzed. TREATMENT  Treatment for prostatitis depends on the cause. If a bacterial infection is the cause, it can be treated with antibiotic medicine. In cases of chronic bacterial prostatitis, the use of antibiotics for up to 1 month or 6 weeks may be necessary. Your health care provider may instruct you to take sitz baths to help relieve pain. A sitz bath is a bath of hot water in which your hips  and buttocks are under water. This relaxes the pelvic floor muscles and often helps to relieve the pressure on your prostate. HOME CARE INSTRUCTIONS   Take all medicines as directed by your health care provider.  Take sitz baths as  directed by your health care provider. SEEK MEDICAL CARE IF:   Your symptoms get worse, not better.  You have a fever. SEEK IMMEDIATE MEDICAL CARE IF:   You have chills.  You feel nauseous or vomit.  You feel lightheaded or faint.  You are unable to urinate.  You have blood or blood clots in your urine. Document Released: 01/26/2000 Document Revised: 11/18/2012 Document Reviewed: 08/17/2012 [facility] Patient Information 2014 [employer].

## 2013-04-02 NOTE — Care Management Note (Signed)
    Page 1 of 1   04/02/2013     4:09:47 PM   CARE MANAGEMENT NOTE 04/02/2013  Patient:  Anthony Schneider, Anthony Schneider   Account Number:  [id number]  Date Initiated:  04/02/2013  Documentation initiated by:  [facility]  Subjective/Objective Assessment:   58 Y/O M ADMITTED W/UTI.     Action/Plan:   FROM HOME.   Anticipated DC Date:  04/03/2013   Anticipated DC Plan:  [place]  CM consult      Choice offered to / List presented to:             Status of service:  In process, will continue to follow Medicare Important Message given?   (If response is "NO", the following Medicare IM given date fields will be blank) Date Medicare IM given:   Date Additional Medicare IM given:    Discharge Disposition:    Per UR Regulation:  Reviewed for med. necessity/level of care/duration of stay  If discussed at [place] of Stay Meetings, dates discussed:    Comments:

## 2013-04-02 NOTE — ED Provider Notes (Signed)
Medical screening examination/treatment/procedure(s) were performed by non-physician practitioner and as supervising physician I was immediately available for consultation/collaboration.  EKG Interpretation   None        Aymara Sassi K Laquisha Northcraft-Rasch, MD 04/02/13 0613 

## 2013-04-03 LAB — CBC WITH DIFFERENTIAL/PLATELET
Basophils Absolute: 0 10*3/uL (ref 0.0–0.1)
Basophils Relative: 0 % (ref 0–1)
Eosinophils Absolute: 0 10*3/uL (ref 0.0–0.7)
Eosinophils Relative: 0 % (ref 0–5)
HCT: 40.6 % (ref 39.0–52.0)
Hemoglobin: 14.1 g/dL (ref 13.0–17.0)
Lymphocytes Relative: 5 % — ABNORMAL LOW (ref 12–46)
Lymphs Abs: 0.4 10*3/uL — ABNORMAL LOW (ref 0.7–4.0)
MCH: 30.9 pg (ref 26.0–34.0)
MCHC: 34.7 g/dL (ref 30.0–36.0)
MCV: 89 fL (ref 78.0–100.0)
Monocytes Absolute: 0.4 10*3/uL (ref 0.1–1.0)
Monocytes Relative: 5 % (ref 3–12)
Neutro Abs: 8.5 10*3/uL — ABNORMAL HIGH (ref 1.7–7.7)
Neutrophils Relative %: 91 % — ABNORMAL HIGH (ref 43–77)
Platelets: 122 10*3/uL — ABNORMAL LOW (ref 150–400)
RBC: 4.56 MIL/uL (ref 4.22–5.81)
RDW: 12.9 % (ref 11.5–15.5)
WBC: 9.3 10*3/uL (ref 4.0–10.5)

## 2013-04-03 LAB — URINE CULTURE
COLONY COUNT: NO GROWTH
Culture: NO GROWTH

## 2013-04-03 LAB — GENTAMICIN LEVEL, RANDOM: Gentamicin Rm: 2.7 ug/mL

## 2013-04-03 MED ORDER — TAMSULOSIN HCL 0.4 MG PO CAPS
0.4000 mg | ORAL_CAPSULE | Freq: Every day | ORAL | Status: DC
  Administered 2013-04-03 – 2013-04-04 (×2): 0.4 mg via ORAL
  Filled 2013-04-03 (×3): qty 1

## 2013-04-03 NOTE — Progress Notes (Signed)
  Subjective: Patient reports feeling a little better this morning than he did yesterday. He still has some dysuria and also reports a slow urinary stream with some hesitancy. No hematuria. He also has been having some low back ache, a headache and diaphoresis.  Objective: Vital signs in last 24 hours: Temp:  [98.8 F (37.1 C)-100.9 F (38.3 C)] 100.9 F (38.3 C) (02/21 0530) Pulse Rate:  [78-110] 78 (02/21 0530) Resp:  [18-20] 18 (02/21 0530) BP: (124-133)/(60-81) 124/69 mmHg (02/21 0530) SpO2:  [96 %-97 %] 97 % (02/21 0530) Weight:  [80.74 kg (178 lb)] 80.74 kg (178 lb) (02/20 1521)  Intake/Output from previous day: 02/20 0701 - 02/21 0700 In: 954 [P.O.:240; I.V.:500; IV Piggyback:214] Out: 500 [Urine:500] Intake/Output this shift:    Physical Exam:  General:alert, cooperative and no distress GI: not done and soft, non tender, normal bowel sounds, no palpable masses   Lab Results:  Recent Labs  04/02/13 1640  HGB 14.5  HCT 41.0   BMET  Recent Labs  04/02/13 1640  NA 133*  K 3.8  CL 96  CO2 22  GLUCOSE 133*  BUN 17  CREATININE 1.02  CALCIUM 8.8   No results found for this basename: LABPT, INR,  in the last 72 hours No results found for this basename: LABURIN,  in the last 72 hours No results found for this or any previous visit.  Studies/Results: Dg Chest 2 View  04/02/2013   CLINICAL DATA:  Fever, flu-like symptoms.  EXAM: CHEST  2 VIEW  COMPARISON:  None.  FINDINGS: Heart is normal size. There is prominence in the left hilum and AP window region. Cannot exclude adenopathy. No confluent airspace on the right. Minimal predominately linear densities in the left base, likely atelectasis. No effusions. No acute bony abnormality.  IMPRESSION: Fullness in the left hilum and AP window region. Cannot exclude adenopathy. This could be further evaluated with chest CT with IV contrast.  Minimal left base density, likely atelectasis.   Electronically Signed   By: Rolm Baptise M.D.   On: 04/02/2013 00:35    Assessment/Plan: He appears to be doing well clinically with some improvement but still having some low-grade fever and diaphoresis. He also has some difficulty initiating his stream and a slow stream. He is not on alpha-blocker so I told him I would start one today. I think that will help with urination over the short term. His cultures, both blood and urine, remain pending at this time. He is on broad-spectrum antibiotics.  Begin tamsulosin.  Continue Zosyn and gentamicin.  Await culture results.  Recheck a CBC.   LOS: 1 day   Rahiem Schellinger,Lauri C 04/03/2013, 8:25 AM

## 2013-04-03 NOTE — Progress Notes (Signed)
Brief Pharmacy Note: Gentamicin   Day 2 Gentamicin 560mg  q24/ Zosyn 3.375gm q8 hr for urosepsis  10 hr Gentamicin level ordered (drawn at 9 hr) = 2.7, well within range to continue same dose.  No change Gentamicin, follow blood and urine cultures ordered 2/20.  Thank you, Minda Ditto PharmD Pager [phone] 04/03/2013, 10:19 AM

## 2013-04-04 MED ORDER — PANTOPRAZOLE SODIUM 40 MG PO TBEC
40.0000 mg | DELAYED_RELEASE_TABLET | Freq: Every day | ORAL | Status: DC
  Administered 2013-04-04: 40 mg via ORAL
  Filled 2013-04-04 (×2): qty 1

## 2013-04-04 NOTE — Progress Notes (Signed)
  Subjective: Patient reports he feels much better today. He is voiding without difficulty now on tamsulosin. His hematuria is clearing.  clearing as well.  Objective: Vital signs in last 24 hours: Temp:  [97.5 F (36.4 C)-98.8 F (37.1 C)] 97.5 F (36.4 C) (02/22 0445) Pulse Rate:  [58-79] 58 (02/22 0445) Resp:  [16-18] 16 (02/22 0445) BP: (121-128)/(67-93) 124/69 mmHg (02/22 0445) SpO2:  [97 %-99 %] 99 % (02/22 0445)  Intake/Output from previous day: 02/21 0701 - 02/22 0700 In: 3870 [P.O.:2220; I.V.:1500; IV Piggyback:150] Out: 575 [Urine:575] Intake/Output this shift:    Physical Exam:  General:alert, cooperative and no distress GI: not done and soft, non tender, normal bowel sounds, no palpable masses   Lab Results:  Recent Labs  04/02/13 1640 04/03/13 0652  HGB 14.5 14.1  HCT 41.0 40.6   BMET  Recent Labs  04/02/13 1640  NA 133*  K 3.8  CL 96  CO2 22  GLUCOSE 133*  BUN 17  CREATININE 1.02  CALCIUM 8.8   No results found for this basename: LABPT, INR,  in the last 72 hours No results found for this basename: LABURIN,  in the last 72 hours Results for orders placed during the hospital encounter of 04/02/13  URINE CULTURE     Status: None   Collection Time    04/02/13  4:27 PM      Result Value Ref Range Status   Specimen Description URINE, CLEAN CATCH   Final   Special Requests NONE   Final   Culture  Setup Time     Final   Value: 04/02/2013 21:53     Performed at [place]     Final   Value: NO GROWTH     Performed at [facility]   Culture     Final   Value: NO GROWTH     Performed at [facility]   Report Status 04/03/2013 FINAL   Final    Studies/Results: No results found.  Assessment/Plan: Clinically he has improved significantly. He remains afebrile now and his white count has returned to normal. The urine culture returned no growth. Blood cultures remain pending. His gross hematuria is  resolving as well.  Continue current Abx. While awaiting final blood culture results.  Hep-lock IV.  Continue Flomax  Anticipate D/C tomorrow.   LOS: 2 days   Tessi Eustache,Nichoals C 04/04/2013, 7:31 AM

## 2013-04-05 NOTE — Discharge Summary (Signed)
Physician Discharge Summary  Patient ID: Anthony Schneider MRN: [id number] DOB/AGE: 1955-05-11 58 y.o.  Admit date: 04/02/2013 Discharge date: 04/05/2013  Admission Diagnoses:  Discharge Diagnoses:  Active Problems:   Sepsis secondary to UTI   Discharged Condition: Improved  Hospital Course: 58 years old male admitted on 2/20 for chills, fever, low back pain.  Had a prostate biopsy on 2/17.  He was given IV Zosyn and gentamicin.  He responded well to treatment.  He defervesed. Today he voids well.  He does not have any dysuria.  Urine is grossly clear.  He tolerates his diet well.  Plan: Discharge home on doxycycline. Biopsy is negative for malignancy. Will follow p in 6 months with repeat PSA.  Consults: None  Significant Diagnostic Studies: Urine and blood cultures  Treatments: IV antibiotics  Discharge Exam: Blood pressure 120/77, pulse 56, temperature 98.6 F (37 C), temperature source Oral, resp. rate 18, height 5\' 7"  (1.702 m), weight 80.74 kg (178 lb), SpO2 98.00%. Abdomen: Soft, non distended, non tender Urine culture: no growth Blood cultures: no growth to date  Disposition: 01-Home or Self Care     Medication List    STOP taking these medications       cephALEXin 500 MG capsule  Commonly known as:  KEFLEX     levofloxacin 500 MG tablet  Commonly known as:  LEVAQUIN      TAKE these medications       acetaminophen 500 MG tablet  Commonly known as:  TYLENOL  Take 500 mg by mouth every 6 (six) hours as needed (pain.).     citalopram 20 MG tablet  Commonly known as:  CELEXA  Take 20 mg by mouth daily.     [employer] PROBIOTIC (LACTOBACILLUS) Caps  Take 1 tablet by mouth daily.     doxycycline 100 MG capsule  Commonly known as:  VIBRAMYCIN  Take 1 capsule (100 mg total) by mouth 2 (two) times daily.     ondansetron 4 MG disintegrating tablet  Commonly known as:  ZOFRAN ODT  Take 1 tablet (4 mg total) by mouth every 8 (eight) hours as needed for nausea or  vomiting.     oxyCODONE-acetaminophen 5-325 MG per tablet  Commonly known as:  PERCOCET  Take 1-2 tablets by mouth every 6 (six) hours as needed for severe pain.           Follow-up Information   Follow up with Ladaysha Soutar, Kirtland Bouchard, MD In 2 weeks.   Specialty:  Urology   Contact information:   [address] [place] [address] [phone]       Signed: Hanley Ben 04/05/2013, 8:18 AM

## 2013-04-09 LAB — CULTURE, BLOOD (ROUTINE X 2)
CULTURE: NO GROWTH
Culture: NO GROWTH

## 2014-07-04 ENCOUNTER — Emergency Department (HOSPITAL_COMMUNITY)
Admission: EM | Admit: 2014-07-04 | Discharge: 2014-07-04 | Disposition: A | Discharge status: Home / Self Care | Payer: BC Managed Care – PPO | Attending: Emergency Medicine | Admitting: Emergency Medicine

## 2014-07-04 ENCOUNTER — Encounter (HOSPITAL_COMMUNITY): Payer: Self-pay | Admitting: Emergency Medicine

## 2014-07-04 DIAGNOSIS — Z79899 Other long term (current) drug therapy: Secondary | ICD-10-CM | POA: Diagnosis not present

## 2014-07-04 DIAGNOSIS — Z8639 Personal history of other endocrine, nutritional and metabolic disease: Secondary | ICD-10-CM | POA: Diagnosis not present

## 2014-07-04 DIAGNOSIS — X58XXXA Exposure to other specified factors, initial encounter: Secondary | ICD-10-CM | POA: Insufficient documentation

## 2014-07-04 DIAGNOSIS — Y9389 Activity, other specified: Secondary | ICD-10-CM | POA: Insufficient documentation

## 2014-07-04 DIAGNOSIS — T7840XA Allergy, unspecified, initial encounter: Secondary | ICD-10-CM | POA: Diagnosis not present

## 2014-07-04 DIAGNOSIS — Y998 Other external cause status: Secondary | ICD-10-CM | POA: Diagnosis not present

## 2014-07-04 DIAGNOSIS — Y9289 Other specified places as the place of occurrence of the external cause: Secondary | ICD-10-CM | POA: Diagnosis not present

## 2014-07-04 DIAGNOSIS — Z792 Long term (current) use of antibiotics: Secondary | ICD-10-CM | POA: Diagnosis not present

## 2014-07-04 MED ORDER — EPINEPHRINE 0.3 MG/0.3ML IJ SOAJ: 0.3000 mg | Freq: Once | INTRAMUSCULAR | Status: DC

## 2014-07-04 MED ORDER — SODIUM CHLORIDE 0.9 % IV BOLUS (SEPSIS)
1000.0000 mL | Freq: Once | INTRAVENOUS | Status: AC
  Administered 2014-07-04: 1000 mL via INTRAVENOUS

## 2014-07-04 MED ORDER — FAMOTIDINE IN NACL 20-0.9 MG/50ML-% IV SOLN
20.0000 mg | Freq: Once | INTRAVENOUS | Status: AC
  Administered 2014-07-04: 20 mg via INTRAVENOUS
  Filled 2014-07-04: qty 50

## 2014-07-04 MED ORDER — METHYLPREDNISOLONE SODIUM SUCC 125 MG IJ SOLR
125.0000 mg | Freq: Once | INTRAMUSCULAR | Status: AC
  Administered 2014-07-04: 125 mg via INTRAVENOUS
  Filled 2014-07-04: qty 2

## 2014-07-04 MED ORDER — DIPHENHYDRAMINE HCL 50 MG/ML IJ SOLN
25.0000 mg | Freq: Once | INTRAMUSCULAR | Status: AC
Start: 2014-07-04 — End: 2014-07-04
  Administered 2014-07-04: 25 mg via INTRAVENOUS
  Filled 2014-07-04: qty 1

## 2014-07-04 MED ORDER — EPINEPHRINE 0.3 MG/0.3ML IJ SOAJ: 0.3000 mg | Freq: Once | INTRAMUSCULAR | Status: AC

## 2014-07-04 MED ORDER — EPINEPHRINE 0.3 MG/0.3ML IJ SOAJ
0.3000 mg | Freq: Once | INTRAMUSCULAR | Status: AC
Start: 2014-07-04 — End: 2014-07-04
  Administered 2014-07-04: 0.3 mg via INTRAMUSCULAR
  Filled 2014-07-04: qty 0.3

## 2014-07-04 NOTE — ED Notes (Addendum)
Pt from home c/o hives that started last night and lip edema that he woke up with this morning. He took benadryl prior to bed and claritin when he woke up. Denies shortness of breath or tongue swelling. Lung sounds clear.

## 2014-07-04 NOTE — ED Notes (Signed)
Questions, concerns r/t dc denied. Pt ambulatory and a&ox4 

## 2014-07-04 NOTE — ED Notes (Signed)
Pt ambulated to restroom with steady gait.

## 2014-07-04 NOTE — ED Notes (Signed)
Assumed care of patient from Henrene Dodge, RN.  Patient in no distress, requested and received ice chips.  Patient's wife at bedside.  Pepcid drip underway.

## 2014-07-04 NOTE — Discharge Instructions (Signed)
Please call your doctor for a followup appointment within 24-48 hours. When you talk to your doctor please let them know that you were seen in the emergency department and have them acquire all of your records so that they can discuss the findings with you and formulate a treatment plan to fully care for your new and ongoing problems. Please follow up with your primary care provider Please rest and stay hydrated  Please take into consideration and monitor what you put into and onto your body  Please follow up with your primary care provider and schedule an allergy test - highly recommended Please use EpiPen only when needed - facial swelling, throat closing sensation regarding an allergic reaction Please continue to monitor symptoms closely and if symptoms are to worsen or change (fever greater than 101, chills, sweating, nausea, vomiting, chest pain, shortness of breathe, difficulty breathing, weakness, numbness, tingling, worsening or changes to pain pattern, fainting, dizziness, headache, tongue swelling, lip swelling, throat closing sensation, inability to breathe) please report back to the Emergency Department immediately.   Allergies  Allergies may happen from anything your body is sensitive to. This may be food, medicines, pollens, chemicals, and many other things. Food allergies can be severe and deadly.  HOME CARE  If you do not know what causes a reaction, keep a diary. Write down the foods you ate and the symptoms that followed. Avoid foods that cause reactions.  If you have red raised spots (hives) or a rash:  Take medicine as told by your doctor.  Use medicines for red raised spots and itching as needed.  Apply cold cloths (compresses) to the skin. Take a cool bath. Avoid hot baths or showers.  If you are severely allergic:  It is often necessary to go to the hospital after you have treated your reaction.  Wear your medical alert jewelry.  You and your family must learn how  to give a allergy shot or use an allergy kit (anaphylaxis kit).  Always carry your allergy kit or shot with you. Use this medicine as told by your doctor if a severe reaction is occurring. GET HELP RIGHT AWAY IF:  You have trouble breathing or are making high-pitched whistling sounds (wheezing).  You have a tight feeling in your chest or throat.  You have a puffy (swollen) mouth.  You have red raised spots, puffiness (swelling), or itching all over your body.  You have had a severe reaction that was helped by your allergy kit or shot. The reaction can return once the medicine has worn off.  You think you are having a food allergy. Symptoms most often happen within 30 minutes of eating a food.  Your symptoms have not gone away within 2 days or are getting worse.  You have new symptoms.  You want to retest yourself with a food or drink you think causes an allergic reaction. Only do this under the care of a doctor. MAKE SURE YOU:   Understand these instructions.  Will watch your condition.  Will get help right away if you are not doing well or get worse. Document Released: 05/26/18 Epinephrine injection (Auto-injector) What is this medicine? EPINEPHRINE (ep i NEF rin) is used for the emergency treatment of severe allergic reactions. You should keep this medicine with you at all times. This medicine may be used for other purposes; ask your health care provider or pharmacist if you have questions. COMMON BRAND NAME(S): Adrenaclick, Auvi-Q, EpiPen, Twinject What should I tell my health care  provider before I take this medicine? They need to know if you have any of the following conditions: -diabetes -heart disease -high blood pressure -lung or breathing disease, like asthma -Parkinson's disease -thyroid disease -an unusual or allergic reaction to epinephrine, sulfites, other medicines, foods, dyes, or preservatives -pregnant or trying to get pregnant -breast-feeding How should  I use this medicine? This medicine is for injection into the outer thigh. Your doctor or health care professional will instruct you on the proper use of the device during an emergency. Read all directions carefully and make sure you understand them. Do not use more often than directed. Talk to your pediatrician regarding the use of this medicine in children. Special care may be needed. This drug is commonly used in children. A special device is available for use in children. Overdosage: If you think you have taken too much of this medicine contact a poison control center or emergency room at once. NOTE: This medicine is only for you. Do not share this medicine with others. What if I miss a dose? This does not apply. You should only use this medicine for an allergic reaction. What may interact with this medicine? This medicine is only used during an emergency. Significant drug interactions are not likely during emergency use. This list may not describe all possible interactions. Give your health care provider a list of all the medicines, herbs, non-prescription drugs, or dietary supplements you use. Also tell them if you smoke, drink alcohol, or use illegal drugs. Some items may interact with your medicine. What should I watch for while using this medicine? Keep this medicine ready for use in the case of a severe allergic reaction. Make sure that you have the phone number of your doctor or health care professional and local hospital ready. Remember to check the expiration date of your medicine regularly. You may need to have additional units of this medicine with you at work, school, or other places. Talk to your doctor or health care professional about your need for extra units. Some emergencies may require an additional dose. Check with your doctor or a health care professional before using an extra dose. After use, go to the nearest hospital or call 911. Avoid physical activity. Make sure the treating  health care professional knows you have received an injection of this medicine. You will receive additional instructions on what to do during and after use of this medicine before a medical emergency occurs. What side effects may I notice from receiving this medicine? Side effects that you should report to your doctor or health care professional as soon as possible: -allergic reactions like skin rash, itching or hives, swelling of the face, lips, or tongue -breathing problems -chest pain -flushing -irregular or pounding heartbeat -numbness in fingers or toes -vomiting Side effects that usually do not require medical attention (report to your doctor or health care professional if they continue or are bothersome): -anxiety or nervousness -dizzy, drowsy -dry mouth -headache -increased sweating -nausea -tired, weak This list may not describe all possible side effects. Call your doctor for medical advice about side effects. You may report side effects to FDA at 1-800-FDA-1088. Where should I keep my medicine? Keep out of the reach of children. Store at room temperature between 15 and 30 degrees C (59 and 86 degrees F). Protect from light and heat. The solution should be clear in color. If the solution is discolored or contains particles it must be replaced. Throw away any unused medicine after the  expiration date. Ask your doctor or pharmacist about proper disposal of the injector if it is expired or has been used. Always replace your auto-injector before it expires. NOTE: This sheet is a summary. It may not cover all possible information. If you have questions about this medicine, talk to your doctor, pharmacist, or health care provider.  2015, [employer]. (2012-06-08 14:59:01) 14 Document Reviewed: 05/25/2012 [facility] Patient Information 2015 [place], [place]. This information is not intended to replace advice given to you by your health care provider. Make sure you discuss any  questions you have with your health care provider.

## 2014-07-04 NOTE — ED Provider Notes (Signed)
CSN: [id number]     Arrival date & time 07/04/14  0549 History   First MD Initiated Contact with Patient 07/04/14 [phone]     Chief Complaint  Patient presents with  . Allergic Reaction     (Consider location/radiation/quality/duration/timing/severity/associated sxs/prior Treatment) The history is provided by the patient. No language interpreter was used.  Anthony Schneider is a 58 y/o M with PMHx of hypogonadism presenting to the ED with an allergic reaction. Patient reported that yesterday evening at approximately 7:30 PM he noticed an itching lump to the bottom of his right foot, stated that within 3-4 hours he noticed hives on himself. Reported that these lesions are itchy and all over. Patient reported that he took Benadryl 50 mg last night and went to bed around 10:30 PM. Patient reported that at approximately 4:00 AM this morning patient woke up with lip swelling to upper and lower lips. Patient reported that he took a Claritin and stated that the swelling did not go down, stated that he came to the ED to be further assessed. Reported that a new sofa just came to the house 2 days ago. Patient reported that they do have dogs, but denied any mites or any infections. Denied ace inhibitors. Denied lip tingling, chest pain, shortness of breath, difficulty breathing, tongue swelling, throat closing sensation, neck swelling, dizziness, nausea, vomiting, syncope, numbness, tingling, loss of sensation, blurred vision, sudden loss of vision, fever, chills, abdominal pain, hiking. Patient to report traveling to [place] and [place] approximately 2 weeks ago. PCP Dr. Inda Merlin  Past Medical History  Diagnosis Date  . Hypogonadism male    Past Surgical History  Procedure Laterality Date  . Tonsillectomy and adenoidectomy    . Prostate biopsy     No family history on file. History  Substance Use Topics  . Smoking status: Never Smoker   . Smokeless tobacco: Never Used  . Alcohol Use: Yes     Comment:  occasionally    Review of Systems  Constitutional: Negative for fever and chills.  HENT: Positive for facial swelling (lip). Negative for trouble swallowing.   Eyes: Negative for visual disturbance.  Respiratory: Negative for chest tightness and shortness of breath.   Cardiovascular: Negative for chest pain.  Gastrointestinal: Negative for nausea, vomiting and abdominal pain.  Musculoskeletal: Negative for neck pain and neck stiffness.  Skin: Positive for rash.  Neurological: Negative for dizziness, syncope, weakness and numbness.      Allergies  Review of patient's allergies indicates no known allergies.  Home Medications   Prior to Admission medications   Medication Sig Start Date End Date Taking? Authorizing Provider  amoxicillin-clavulanate (AUGMENTIN) 500-125 MG per tablet Take 1 tablet by mouth 2 (two) times daily.   Yes Historical Provider, MD  citalopram (CELEXA) 20 MG tablet Take 20 mg by mouth daily.   Yes Historical Provider, MD  diphenhydrAMINE (BENADRYL) 25 mg capsule Take 25 mg by mouth every 6 (six) hours as needed (allergic reaction).   Yes Historical Provider, MD  loratadine (CLARITIN) 10 MG tablet Take 10 mg by mouth daily as needed for allergies.   Yes Historical Provider, MD  acetaminophen (TYLENOL) 500 MG tablet Take 500 mg by mouth every 6 (six) hours as needed (pain.).     Historical Provider, MD  doxycycline (VIBRAMYCIN) 100 MG capsule Take 1 capsule (100 mg total) by mouth 2 (two) times daily. 04/02/13   Jennifer Piepenbrink, PA-C  Lactobacillus Rhamnosus, GG, ([employer] PROBIOTIC, LACTOBACILLUS,) CAPS Take 1 tablet by mouth  daily. 04/02/13   Baron Sane, PA-C  ondansetron (ZOFRAN ODT) 4 MG disintegrating tablet Take 1 tablet (4 mg total) by mouth every 8 (eight) hours as needed for nausea or vomiting. 04/02/13   Baron Sane, PA-C  oxyCODONE-acetaminophen (PERCOCET) 5-325 MG per tablet Take 1-2 tablets by mouth every 6 (six) hours as needed for severe  pain. 04/02/13   Jennifer Piepenbrink, PA-C   BP 132/79 mmHg  Pulse 65  Temp(Src) 97.8 F (36.6 C) (Oral)  Resp 17  SpO2 97% Physical Exam  Constitutional: He is oriented to person, place, and time. He appears well-developed and well-nourished. No distress.  Patient sitting comfortably upright in bed in no sign of acute distress  HENT:  Head: Normocephalic and atraumatic.  Mouth/Throat: Oropharynx is clear and moist. No oropharyngeal exudate.  Angioedema to the lips upper and lower identified  Negative tongue swelling  Negative lesions noted to the oral mucosa  Eyes: Conjunctivae and EOM are normal. Pupils are equal, round, and reactive to light. Right eye exhibits no discharge. Left eye exhibits no discharge.  Neck: Normal range of motion. Neck supple. No tracheal deviation present.  Negative neck stiffness Negative nuchal rigidity Negative cervical lymphadenopathy  Cardiovascular: Normal rate, regular rhythm and normal heart sounds.  Exam reveals no friction rub.   No murmur heard. Pulses:      Radial pulses are 2+ on the right side, and 2+ on the left side.       Dorsalis pedis pulses are 2+ on the right side, and 2+ on the left side.  Cap refill less than 3 seconds  Pulmonary/Chest: Effort normal and breath sounds normal. No respiratory distress. He has no wheezes. He has no rales.  Patient is able to speak in full sentences without difficulty Negative use of accessory muscles Negative stridor  Musculoskeletal: Normal range of motion.  Full ROM to upper and lower extremities without difficulty noted, negative ataxia noted.  Lymphadenopathy:    He has no cervical adenopathy.  Neurological: He is alert and oriented to person, place, and time. No cranial nerve deficit. He exhibits normal muscle tone. Coordination normal. GCS eye subscore is 4. GCS verbal subscore is 5. GCS motor subscore is 6.  Cranial nerves III-XII grossly intact Strength 5+/5+ to upper and lower extremities  bilaterally with resistance applied, equal distribution noted Equal grip strength bilaterally Negative facial droop Negative slurred speech Negative aphasia Patient follows commands  Patient responds to questions appropriately   Skin: Skin is warm and dry. Rash noted. He is not diaphoretic.  Hives scattered to the body - localized to bilateral hips and scattered throughout the legs. Blanchable. Negative areas of fluctuance or induration. Negative pain upon palpation to the lesions. Negative pustules of formation of vesicles noted. On bilateral sides of the body-not unilateral. Negative active drainage or bleeding noted. Negative sloughing of the skin.  Psychiatric: He has a normal mood and affect. His behavior is normal. Thought content normal.  Nursing note and vitals reviewed.   ED Course  Procedures (including critical care time) Labs Review Labs Reviewed - No data to display  Imaging Review No results found.   EKG Interpretation None       7:36 AM This provider re-assessed the patient. Patient sitting comfortably upright in bed in no acute distress. Patient reported that he is feeling fine. Continues to speak in full sentences without difficulty. Patient reported that he continues to has lip swelling and tightness in his lips.   7:45 AM Patient seen  by attending physician, Dr. Clayborn Heron - reported that if medications do not help try to use epinephrine.   8:24 AM Patient re-assessed. Hives have improved. Patient continues to have swelling to the lips. Air way intact without obstruction. Negative signs of respiratory distress.  Discussed with attending physician, Dr. Rosalyn Gess. Physician instructed to use EpiPen.   9:41 AM Patient re-assessed. Patient doing well. Bottom lip has decreased in swelling, back to baseline. Upper lip has decreased in swelling as well. Airway intact.   11:53 AM Patient re-assessed. Patient sitting comfortably upright in bed. Lip swelling has now  resolved. Patient speaking in full sentences without difficulty. Hives has resolved. Patient has been monitored in the ED setting for 3 hours after Epi, 6 hours in total - negative signs of worsening symptoms, have improved and feels comfortable to go home.   MDM   Final diagnoses:  Allergic reaction, initial encounter    Medications  diphenhydrAMINE (BENADRYL) injection 25 mg (25 mg Intravenous Given 07/04/14 0653)  famotidine (PEPCID) IVPB 20 mg premix (0 mg Intravenous Stopped 07/04/14 0750)  methylPREDNISolone sodium succinate (SOLU-MEDROL) 125 mg/2 mL injection 125 mg (125 mg Intravenous Given 07/04/14 0653)  sodium chloride 0.9 % bolus 1,000 mL (0 mLs Intravenous Stopped 07/04/14 0830)  EPINEPHrine (EPI-PEN) injection 0.3 mg (0.3 mg Intramuscular Given 07/04/14 0903)    Filed Vitals:   07/04/14 0618 07/04/14 0719 07/04/14 0929 07/04/14 1155  BP:  132/79 128/69 134/81  Pulse: 63 65 68 81  Temp:      TempSrc:   Oral   Resp: 17 17 18 18   SpO2:  97% 95% 95%    Patient presenting to the ED with angioedema to the lips and hives. Hives starting yesterday evening, lip swelling early this morning. Patient used Benadryl and Claritin at home.  Rash appears to be hive-like. Doubt shingles. Doubt erythema multiform major and minor. Doubt SJS. Angioedema of the lips noted. Patient is able to hold a conversation without difficulty - negative use of accessory muscles. Negative stridor. Air way intact without obstruction. Patient given IV fluids, benadryl, pepcid, steroids, and EpiPen in the ED setting and monitored. Patient has been monitored in the ED setting for 6 hours total, 3 hours after epinephrine was administered. Patient doing well - symptoms have resolved. Patient tolerating food and fluids PO. Lip swelling and hives have disappeared. Patient denied chest pain, shortness of breath, difficulty breathing. Negative tongue swelling, stridor, respiratory distress - negative involvement of airway.  Patient seen and assessed by attending physician, Dr. Rosalyn Gess. Patient stable, afebrile. Patient not septic appearing. Negative signs of respiratory distress. Discharged patient. Discharged patient with EpiPen. Patient educated on how to use an EpiPen. Discussed with patient to rest and stay hydrated. Discussed with patient to closely monitor what he puts into and onto his body. Discussed with patient to follow up with PCP and for patient to get an allergy screening test performed. Discussed with patient to closely monitor symptoms and if symptoms are to worsen or change to report back to the ED - strict return instructions given.  Patient agreed to plan of care, understood, all questions answered.   Jamse Mead, PA-C 07/04/14 1214  Elnora Morrison, MD 07/04/14 [phone]

## 2016-03-28 ENCOUNTER — Ambulatory Visit: Payer: BC Managed Care – PPO

## 2018-03-30 ENCOUNTER — Other Ambulatory Visit: Payer: Self-pay | Admitting: Family Medicine

## 2018-03-30 ENCOUNTER — Other Ambulatory Visit: Payer: Self-pay | Admitting: Critical Care Medicine

## 2018-03-30 DIAGNOSIS — R1013 Epigastric pain: Secondary | ICD-10-CM

## 2018-04-10 ENCOUNTER — Ambulatory Visit
Admission: RE | Admit: 2018-04-10 | Discharge: 2018-04-10 | Disposition: A | Discharge status: Home / Self Care | Payer: BC Managed Care – PPO | Source: Ambulatory Visit | Attending: Family Medicine | Admitting: Family Medicine

## 2018-04-10 DIAGNOSIS — R1013 Epigastric pain: Secondary | ICD-10-CM

## 2018-10-31 ENCOUNTER — Other Ambulatory Visit: Payer: Self-pay

## 2018-10-31 DIAGNOSIS — Z20822 Contact with and (suspected) exposure to covid-19: Secondary | ICD-10-CM

## 2018-11-01 LAB — NOVEL CORONAVIRUS, NAA: SARS-CoV-2, NAA: NOT DETECTED

## 2019-04-08 ENCOUNTER — Ambulatory Visit: Payer: BC Managed Care – PPO | Attending: Family

## 2019-04-08 DIAGNOSIS — Z23 Encounter for immunization: Secondary | ICD-10-CM | POA: Insufficient documentation

## 2019-04-08 NOTE — Progress Notes (Signed)
   Covid-19 Vaccination Clinic  Name:  Anthony Schneider    MRN: [id number] DOB: 01/18/1956  04/08/2019  [name] was observed post Covid-19 immunization for 15 minutes without incidence. He was provided with Vaccine Information Sheet and instruction to access the V-Safe system.   [name] was instructed to call 911 with any severe reactions post vaccine: Marland Kitchen Difficulty breathing  . Swelling of your face and throat  . A fast heartbeat  . A bad rash all over your body  . Dizziness and weakness    Immunizations Administered    Name Date Dose VIS Date Route   Moderna COVID-19 Vaccine 04/08/2019  3:26 PM 0.5 mL 01/12/2019 Intramuscular   Manufacturer: Moderna   LotKQ:[id number]   [place]PO:[id number]

## 2019-05-11 ENCOUNTER — Ambulatory Visit: Payer: BC Managed Care – PPO | Attending: Family

## 2019-05-11 DIAGNOSIS — Z23 Encounter for immunization: Secondary | ICD-10-CM

## 2019-05-11 NOTE — Progress Notes (Signed)
   Covid-19 Vaccination Clinic  Name:  Anthony Schneider    MRN: [id number] DOB: 1955/08/23  05/11/2019  Mr. Zilberman was observed post Covid-19 immunization for 30 minutes based on pre-vaccination screening without incident. He was provided with Vaccine Information Sheet and instruction to access the V-Safe system.   Mr. Ricke was instructed to call 911 with any severe reactions post vaccine: Marland Kitchen Difficulty breathing  . Swelling of face and throat  . A fast heartbeat  . A bad rash all over body  . Dizziness and weakness   Immunizations Administered    Name Date Dose VIS Date Route   Moderna COVID-19 Vaccine 05/11/2019  4:01 PM 0.5 mL 01/12/2019 Intramuscular   Manufacturer: Moderna   Lot: [id number]   [place]BE:[id number]

## 2019-09-30 ENCOUNTER — Other Ambulatory Visit: Payer: Self-pay | Admitting: Urology

## 2019-09-30 DIAGNOSIS — R972 Elevated prostate specific antigen [PSA]: Secondary | ICD-10-CM

## 2019-10-24 ENCOUNTER — Ambulatory Visit
Admission: RE | Admit: 2019-10-24 | Discharge: 2019-10-24 | Disposition: A | Discharge status: Home / Self Care | Payer: BC Managed Care – PPO | Source: Ambulatory Visit | Attending: Urology | Admitting: Urology

## 2019-10-24 ENCOUNTER — Other Ambulatory Visit: Payer: Self-pay

## 2019-10-24 DIAGNOSIS — R972 Elevated prostate specific antigen [PSA]: Secondary | ICD-10-CM

## 2019-10-24 MED ORDER — GADOBENATE DIMEGLUMINE 529 MG/ML IV SOLN
16.0000 mL | Freq: Once | INTRAVENOUS | Status: AC | PRN
  Administered 2019-10-24: 16 mL via INTRAVENOUS

## 2021-03-13 ENCOUNTER — Inpatient Hospital Stay (HOSPITAL_COMMUNITY)
Admission: EM | Admit: 2021-03-13 | Discharge: 2021-03-15 | DRG: 418 | Disposition: A | Discharge status: Home / Self Care | Payer: BC Managed Care – PPO | Attending: Internal Medicine | Admitting: Internal Medicine

## 2021-03-13 ENCOUNTER — Emergency Department (HOSPITAL_COMMUNITY): Payer: BC Managed Care – PPO

## 2021-03-13 ENCOUNTER — Other Ambulatory Visit: Payer: Self-pay

## 2021-03-13 ENCOUNTER — Encounter (HOSPITAL_COMMUNITY): Payer: Self-pay

## 2021-03-13 DIAGNOSIS — T373X5A Adverse effect of other antiprotozoal drugs, initial encounter: Secondary | ICD-10-CM | POA: Diagnosis present

## 2021-03-13 DIAGNOSIS — R7989 Other specified abnormal findings of blood chemistry: Secondary | ICD-10-CM

## 2021-03-13 DIAGNOSIS — K8030 Calculus of bile duct with cholangitis, unspecified, without obstruction: Secondary | ICD-10-CM | POA: Diagnosis present

## 2021-03-13 DIAGNOSIS — K219 Gastro-esophageal reflux disease without esophagitis: Secondary | ICD-10-CM | POA: Diagnosis present

## 2021-03-13 DIAGNOSIS — Y92239 Unspecified place in hospital as the place of occurrence of the external cause: Secondary | ICD-10-CM | POA: Diagnosis present

## 2021-03-13 DIAGNOSIS — G4733 Obstructive sleep apnea (adult) (pediatric): Secondary | ICD-10-CM | POA: Diagnosis present

## 2021-03-13 DIAGNOSIS — K8044 Calculus of bile duct with chronic cholecystitis without obstruction: Principal | ICD-10-CM | POA: Diagnosis present

## 2021-03-13 DIAGNOSIS — K8050 Calculus of bile duct without cholangitis or cholecystitis without obstruction: Secondary | ICD-10-CM

## 2021-03-13 DIAGNOSIS — L500 Allergic urticaria: Secondary | ICD-10-CM | POA: Diagnosis present

## 2021-03-13 DIAGNOSIS — L509 Urticaria, unspecified: Secondary | ICD-10-CM

## 2021-03-13 DIAGNOSIS — Z79899 Other long term (current) drug therapy: Secondary | ICD-10-CM | POA: Diagnosis not present

## 2021-03-13 DIAGNOSIS — K8309 Other cholangitis: Secondary | ICD-10-CM | POA: Diagnosis present

## 2021-03-13 DIAGNOSIS — T7840XA Allergy, unspecified, initial encounter: Secondary | ICD-10-CM | POA: Diagnosis not present

## 2021-03-13 DIAGNOSIS — Z20822 Contact with and (suspected) exposure to covid-19: Secondary | ICD-10-CM | POA: Diagnosis present

## 2021-03-13 DIAGNOSIS — K831 Obstruction of bile duct: Secondary | ICD-10-CM

## 2021-03-13 DIAGNOSIS — K760 Fatty (change of) liver, not elsewhere classified: Secondary | ICD-10-CM | POA: Diagnosis present

## 2021-03-13 DIAGNOSIS — R1011 Right upper quadrant pain: Secondary | ICD-10-CM

## 2021-03-13 DIAGNOSIS — N400 Enlarged prostate without lower urinary tract symptoms: Secondary | ICD-10-CM | POA: Diagnosis not present

## 2021-03-13 LAB — LIPASE, BLOOD: Lipase: 35 U/L (ref 11–51)

## 2021-03-13 LAB — COMPREHENSIVE METABOLIC PANEL
ALT: 435 U/L — ABNORMAL HIGH (ref 0–44)
AST: 231 U/L — ABNORMAL HIGH (ref 15–41)
Albumin: 4.2 g/dL (ref 3.5–5.0)
Alkaline Phosphatase: 184 U/L — ABNORMAL HIGH (ref 38–126)
Anion gap: 8 (ref 5–15)
BUN: 14 mg/dL (ref 8–23)
CO2: 23 mmol/L (ref 22–32)
Calcium: 9.2 mg/dL (ref 8.9–10.3)
Chloride: 106 mmol/L (ref 98–111)
Creatinine, Ser: 0.76 mg/dL (ref 0.61–1.24)
GFR, Estimated: >60 mL/min (ref >60)
Glucose, Bld: 179 mg/dL — ABNORMAL HIGH (ref 70–99)
Potassium: 3.6 mmol/L (ref 3.5–5.1)
Sodium: 137 mmol/L (ref 135–145)
Total Bilirubin: 6.8 mg/dL — ABNORMAL HIGH (ref 0.3–1.2)
Total Protein: 7.2 g/dL (ref 6.5–8.1)

## 2021-03-13 LAB — CBC WITH DIFFERENTIAL/PLATELET
Abs Immature Granulocytes: 0.03 10*3/uL (ref 0.00–0.07)
Basophils Absolute: 0.1 10*3/uL (ref 0.0–0.1)
Basophils Relative: 1 %
Eosinophils Absolute: 0 10*3/uL (ref 0.0–0.5)
Eosinophils Relative: 0 %
HCT: 44.7 % (ref 39.0–52.0)
Hemoglobin: 15.6 g/dL (ref 13.0–17.0)
Immature Granulocytes: 0 %
Lymphocytes Relative: 8 %
Lymphs Abs: 0.9 10*3/uL (ref 0.7–4.0)
MCH: 31 pg (ref 26.0–34.0)
MCHC: 34.9 g/dL (ref 30.0–36.0)
MCV: 88.9 fL (ref 80.0–100.0)
Monocytes Absolute: 0.7 10*3/uL (ref 0.1–1.0)
Monocytes Relative: 6 %
Neutro Abs: 8.8 10*3/uL — ABNORMAL HIGH (ref 1.7–7.7)
Neutrophils Relative %: 85 %
Platelets: 254 10*3/uL (ref 150–400)
RBC: 5.03 MIL/uL (ref 4.22–5.81)
RDW: 13.2 % (ref 11.5–15.5)
WBC: 10.5 10*3/uL (ref 4.0–10.5)
nRBC: 0 % (ref 0.0–0.2)

## 2021-03-13 LAB — URINALYSIS, ROUTINE W REFLEX MICROSCOPIC
Glucose, UA: NEGATIVE mg/dL
Hgb urine dipstick: NEGATIVE
Ketones, ur: NEGATIVE mg/dL
Leukocytes,Ua: NEGATIVE
Nitrite: NEGATIVE
Protein, ur: NEGATIVE mg/dL
Specific Gravity, Urine: >1.046 — ABNORMAL HIGH (ref 1.005–1.030)
pH: 6 (ref 5.0–8.0)

## 2021-03-13 MED ORDER — METRONIDAZOLE 500 MG/100ML IV SOLN
500.0000 mg | Freq: Two times a day (BID) | INTRAVENOUS | Status: DC
  Administered 2021-03-13: 500 mg via INTRAVENOUS
  Filled 2021-03-13: qty 100

## 2021-03-13 MED ORDER — CEFTRIAXONE SODIUM 2 G IJ SOLR
2.0000 g | INTRAMUSCULAR | Status: DC
  Administered 2021-03-14: 2 g via INTRAVENOUS
  Filled 2021-03-13: qty 20

## 2021-03-13 MED ORDER — DIPHENHYDRAMINE HCL 50 MG/ML IJ SOLN
INTRAMUSCULAR | Status: AC
  Administered 2021-03-13: 25 mg via INTRAVENOUS
  Filled 2021-03-13: qty 1

## 2021-03-13 MED ORDER — DIPHENHYDRAMINE HCL 50 MG/ML IJ SOLN: 25.0000 mg | Freq: Once | INTRAMUSCULAR | Status: AC

## 2021-03-13 MED ORDER — IOHEXOL 350 MG/ML SOLN
100.0000 mL | Freq: Once | INTRAVENOUS | Status: AC | PRN
  Administered 2021-03-13: 100 mL via INTRAVENOUS

## 2021-03-13 MED ORDER — LACTATED RINGERS IV SOLN: INTRAVENOUS | Status: DC

## 2021-03-13 MED ORDER — CEFTRIAXONE SODIUM 2 G IJ SOLR
2.0000 g | Freq: Once | INTRAMUSCULAR | Status: AC
  Administered 2021-03-13: 2 g via INTRAVENOUS
  Filled 2021-03-13: qty 20

## 2021-03-13 MED ORDER — MORPHINE SULFATE (PF) 2 MG/ML IV SOLN
1.0000 mg | INTRAVENOUS | Status: DC | PRN
  Administered 2021-03-13: 22:00:00 1 mg via INTRAVENOUS
  Filled 2021-03-13: qty 1

## 2021-03-13 NOTE — ED Provider Notes (Addendum)
[facility] DEPT Provider Note   CSN: [id number] Arrival date & time: 03/13/21  1615     History  Chief Complaint  Patient presents with   Abdominal Pain    Anthony Schneider is a 66 y.o. male.  Patient is a 66 year old male with no significant medical history who is presenting today with jaundice, fever and abdominal pain.  Patient reports something similar occurred in December.  At that time he had gone to urgent care and they told him he had a urinary tract infection he was given an antibiotic and his symptoms resolved.  Then approximately 2 weeks ago his symptoms started to return.  He reports an intermittent aching pain that is generalized in the mid abdomen and he feels in his lower back with some intermittent nausea.  He reports his urine becomes very dark and his stool is clay colored.  He has had decreased appetite but has been able to eat until last night he reported his pain became very severe and he vomited once at 3:00 this morning.  Now he reports the pain is much more tolerable but it did seem to get worse yesterday after he ate pizza.  When he started noticing the symptoms 2 weeks ago he went back to urgent care and at that time they did a lab work-up and found that he had elevated bilirubin and LFTs.  He returned because he was having fever and the pain to his PCP and they did more testing and labs continued to be abnormal.  He had an ultrasound done outpatient that so showed gallbladder sludge but because the pain was worsening today and he had the fever they recommended he come to the emergency room.  Patient did have a negative hepatitis panels.  He had been drinking 2 beers per day but stopped 2 weeks ago when the symptoms started.  He denies any chest congestion, cough or shortness of breath.  He has had a COVID test within the last 4 days which was negative.  His last colonoscopy was about 3 years ago in [place] and he was told that everything  was normal.  He had an appendectomy but no other abdominal surgeries  The history is provided by the patient and medical records.  Abdominal Pain     Home Medications Prior to Admission medications   Medication Sig Start Date End Date Taking? Authorizing Provider  acetaminophen (TYLENOL) 500 MG tablet Take 500 mg by mouth every 6 (six) hours as needed (pain.).     [provider]  amoxicillin-clavulanate (AUGMENTIN) 500-125 MG per tablet Take 1 tablet by mouth 2 (two) times daily.    [provider]  citalopram (CELEXA) 20 MG tablet Take 20 mg by mouth daily.    [provider]  diphenhydrAMINE (BENADRYL) 25 mg capsule Take 25 mg by mouth every 6 (six) hours as needed (allergic reaction).    [provider]  doxycycline (VIBRAMYCIN) 100 MG capsule Take 1 capsule (100 mg total) by mouth 2 (two) times daily. 04/02/13   Piepenbrink, Anderson Malta, PA-C  EPINEPHrine 0.3 mg/0.3 mL IJ SOAJ injection Inject 0.3 mLs (0.3 mg total) into the muscle once. 07/04/14   Sciacca, Marissa, PA-C  Lactobacillus Rhamnosus, GG, ([employer] PROBIOTIC, LACTOBACILLUS,) CAPS Take 1 tablet by mouth daily. 04/02/13   Piepenbrink, Anderson Malta, PA-C  loratadine (CLARITIN) 10 MG tablet Take 10 mg by mouth daily as needed for allergies.    [provider]  ondansetron (ZOFRAN ODT) 4 MG disintegrating  tablet Take 1 tablet (4 mg total) by mouth every 8 (eight) hours as needed for nausea or vomiting. 04/02/13   Piepenbrink, Anderson Malta, PA-C  oxyCODONE-acetaminophen (PERCOCET) 5-325 MG per tablet Take 1-2 tablets by mouth every 6 (six) hours as needed for severe pain. 04/02/13   Piepenbrink, Anderson Malta, PA-C      Allergies    Patient has no known allergies.    Review of Systems   Review of Systems  Gastrointestinal:  Positive for abdominal pain.   Physical Exam Updated Vital Signs BP 126/73    Pulse (!) 57    Temp (!) 100.9 F (38.3 C) (Oral)    Resp 14    Ht 5\' 7"  (1.702 m)    Wt 79.4 kg    SpO2  98%    BMI 27.41 kg/m  Physical Exam Vitals and nursing note reviewed.  Constitutional:      General: He is not in acute distress.    Appearance: He is well-developed.  HENT:     Head: Normocephalic and atraumatic.     Mouth/Throat:     Mouth: Mucous membranes are moist.  Eyes:     General: Scleral icterus present.     Conjunctiva/sclera: Conjunctivae normal.     Pupils: Pupils are equal, round, and reactive to light.  Cardiovascular:     Rate and Rhythm: Normal rate and regular rhythm.     Heart sounds: No murmur heard. Pulmonary:     Effort: Pulmonary effort is normal. No respiratory distress.     Breath sounds: Normal breath sounds. No wheezing or rales.  Abdominal:     General: There is no distension.     Palpations: Abdomen is soft.     Tenderness: There is abdominal tenderness in the right upper quadrant, epigastric area and periumbilical area. There is no right CVA tenderness, left CVA tenderness, guarding or rebound.  Musculoskeletal:        General: No tenderness. Normal range of motion.     Cervical back: Normal range of motion and neck supple.  Skin:    General: Skin is warm and dry.     Coloration: Skin is jaundiced.     Findings: No erythema or rash.  Neurological:     Mental Status: He is alert and oriented to person, place, and time. Mental status is at baseline.  Psychiatric:        Mood and Affect: Mood normal.        Behavior: Behavior normal.    ED Results / Procedures / Treatments   Labs (all labs ordered are listed, but only abnormal results are displayed) Labs Reviewed  CBC WITH DIFFERENTIAL/PLATELET - Abnormal; Notable for the following components:      Result Value   Neutro Abs 8.8 (*)    All other components within normal limits  COMPREHENSIVE METABOLIC PANEL - Abnormal; Notable for the following components:   Glucose, Bld 179 (*)    AST 231 (*)    ALT 435 (*)    Alkaline Phosphatase 184 (*)    Total Bilirubin 6.8 (*)    All other  components within normal limits  LIPASE, BLOOD  URINALYSIS, ROUTINE W REFLEX MICROSCOPIC    EKG None  Radiology CT ABDOMEN PELVIS W CONTRAST  Result Date: 03/13/2021 CLINICAL DATA:  Acute abdominal pain.  Right upper quadrant pain. EXAM: CT ABDOMEN AND PELVIS WITH CONTRAST TECHNIQUE: Multidetector CT imaging of the abdomen and pelvis was performed using the standard protocol following bolus administration of intravenous  contrast. RADIATION DOSE REDUCTION: This exam was performed according to the departmental dose-optimization program which includes automated exposure control, adjustment of the mA and/or kV according to patient size and/or use of iterative reconstruction technique. CONTRAST:  124mL OMNIPAQUE IOHEXOL 350 MG/ML SOLN COMPARISON:  Ultrasound abdomen 03/13/2021. FINDINGS: Lower chest: No acute abnormality. Hepatobiliary: Gallbladder sludge is present. There are 2 small echogenic foci within the distal common bile duct at the level of the pancreatic head suspicious for choledocholithiasis. Common bile duct measures 1 cm as seen on prior ultrasound. No focal liver lesions are identified. No pericholecystic inflammation. Pancreas: Unremarkable. No pancreatic ductal dilatation or surrounding inflammatory changes. Spleen: Normal in size without focal abnormality. Adrenals/Urinary Tract: Adrenal glands are unremarkable. Kidneys are normal, without renal calculi, focal lesion, or hydronephrosis. Bladder is unremarkable. Stomach/Bowel: Stomach is within normal limits. Appendix is surgically absent. No evidence of bowel wall thickening, distention, or inflammatory changes. There is colonic diverticulosis without evidence for acute diverticulitis. Vascular/Lymphatic: Aortic atherosclerosis. No enlarged abdominal or pelvic lymph nodes. Reproductive: Prostate gland is enlarged. Other: No abdominal wall hernia or abnormality. No abdominopelvic ascites. Musculoskeletal: No acute or significant osseous  findings. IMPRESSION: 1. Dilated common bile duct with findings suspicious for choledocholithiasis. Other common bile duct lesions not excluded. This can be further evaluated with ERCP or MRCP as clinically warranted. 2. Gallbladder sludge. 3. Colonic diverticulosis. 4.  Aortic Atherosclerosis (ICD10-I70.0). 5. Prostatomegaly. Electronically Signed   By: Ronney Asters M.D.   On: 03/13/2021 19:54   US Abdomen Limited RUQ (LIVER/GB)  Result Date: 03/13/2021 CLINICAL DATA:  Right upper quadrant abdominal pain. EXAM: ULTRASOUND ABDOMEN LIMITED RIGHT UPPER QUADRANT COMPARISON:  Abdominal ultrasound 03/09/2021. FINDINGS: Gallbladder: Gallbladder sludge is seen. Gallbladder wall is upper limits of normal in thickness. No gallstones. No pericholecystic fluid. No sonographic Murphy sign noted by sonographer. Common bile duct: Diameter: 1.0 cm Liver: No focal lesion identified. Increase in parenchymal echogenicity. Portal vein is patent on color Doppler imaging with normal direction of blood flow towards the liver. Other: None. IMPRESSION: 1. There is new dilatation of the common bile duct measuring 1 cm concerning for distal biliary obstruction. 2. Gallbladder sludge. 3. Echogenic liver likely related to fatty infiltration. Electronically Signed   By: Ronney Asters M.D.   On: 03/13/2021 17:13    Procedures Procedures    Medications Ordered in ED Medications  lactated ringers infusion (has no administration in time range)  cefTRIAXone (ROCEPHIN) 2 g in sodium chloride 0.9 % 100 mL IVPB (0 g Intravenous Stopped 03/13/21 2026)  iohexol (OMNIPAQUE) 350 MG/ML injection 100 mL (100 mLs Intravenous Contrast Given 03/13/21 1939)    ED Course/ Medical Decision Making/ A&P                           Medical Decision Making Amount and/or Complexity of Data Reviewed External Data Reviewed: labs, radiology and notes. Labs: ordered. Decision-making details documented in ED Course. Radiology: ordered and independent  interpretation performed. Decision-making details documented in ED Course.  Risk Prescription drug management. Decision regarding hospitalization.   Patient is presenting today with fever, nonspecific abdominal pain and jaundice.  I independently interpreted patient's labs and today he has a normal CBC, CMP with elevated LFTs with an AST of 231, ALT of 435 and a total bilirubin of 6.8.  Based on external medical records from his PCP he had an elevated GGT and elevated LFTs within the last 5 days.  Outpatient ultrasound showed biliary  sludge.  I independently evaluated the ultrasound today and radiology reported new dilation of the common bile duct measuring 1 cm concerning for distal biliary obstruction as well as gallbladder sludge.  Concern for a sending cholangitis when patients above symptoms versus obstructive choledocholithiasis.  However patient was febrile here to 100.9.  He is not ill-appearing at this time.  He reports the pain is minimal and he does not want any medication at this time for it.  Because patient has more diffuse abdominal pain a CT was done to ensure no masses or other abnormal findings which was negative except for dilated common bile duct.  Will consult gastroenterology.  Patient was given Rocephin.  Based on patient's findings and high risk for morbidity mortality he meets admission criteria.  Findings start were discussed with the patient and his wife.  Their questions were answered.  8:33 PM Spoke with Dr. Virgina Organ with GI and they recommend n.p.o. after midnight, ongoing IV fluids, ERCP tomorrow.  Continuing antibiotics.       Final Clinical Impression(s) / ED Diagnoses Final diagnoses:  RUQ abdominal pain  Ascending cholangitis    Rx / DC Orders ED Discharge Orders     None         Blanchie Dessert, MD 03/13/21 2016    Blanchie Dessert, MD 03/13/21 2033

## 2021-03-13 NOTE — H&P (Signed)
History and Physical    Anthony Schneider GEZ:[id number] DOB: 10/06/1955 DOA: 03/13/2021  PCP: Lujean Amel, MD  Patient coming from: Home  I have personally briefly reviewed patient's old medical records in [place]  Chief Complaint: Abnormal total bilirubin  HPI: Anthony Schneider is a 66 y.o. male with medical history significant for OSA, GERD, BPH for elevated total bilirubin.  Patient had issues of upper abdominal pain and changes in color of his urine back in December. Initially diagnosed with UTI and treated with antibiotics with some improvement.  However, for the past 2 weeks he has had ongoing upper abdominal pain with bloating.  Also has mid to lower back pain.  Has been having chills for the past several days.  Had temperature of 100.2.  Also had nausea and vomiting today.  He had more lab work done yesterday by his PCP and was found to have elevated total bilirubin and asked to present to the ED.  ED Course: He was febrile up to 100.14F, heart rate of 70 and stable on room air.  No leukocytosis or anemia.  No electrolyte abnormalities.  Creatinine stable at 0.76. AST is elevated to 231, ALT of 435, alkaline phosphatase of 184.  Total bilirubin was 6.8.  UA with small bilirubin, negative nitrate or leukocyte, high specific gravity.  CT of the abdomen pelvis shows dilated common bile duct with findings suspicious of choledocholithiasis.  Other common bile duct lesions not excluded.  Gallbladder sludge.  ED physician discussed with GI Dr. Therisa Doyne who recommends patient be kept n.p.o. past midnight for ERCP tomorrow.  He was started on IV Rocephin.  Hospitalist then called for further management.  Review of Systems:  Pertinent negatives and positives stated above in the HPI  Past Medical History:  Diagnosis Date   Hypogonadism male     Past Surgical History:  Procedure Laterality Date   PROSTATE BIOPSY     TONSILLECTOMY AND ADENOIDECTOMY       reports that he has  never smoked. He has never used smokeless tobacco. He reports current alcohol use. He reports that he does not use drugs. Social History  Allergies  Allergen Reactions   Penicillin G Anaphylaxis    History reviewed. No pertinent family history.   Prior to Admission medications   Medication Sig Start Date End Date Taking? Authorizing Provider  EPINEPHrine 0.3 mg/0.3 mL IJ SOAJ injection Inject 0.3 mLs (0.3 mg total) into the muscle once. Patient taking differently: Inject 0.3 mg into the muscle once as needed for anaphylaxis. 07/04/14  Yes Sciacca, Marissa, PA-C  ibuprofen (ADVIL) 200 MG tablet Take 200-400 mg by mouth every 6 (six) hours as needed for mild pain or headache.   Yes [provider]  loratadine (CLARITIN) 10 MG tablet Take 10 mg by mouth daily as needed for allergies.   Yes [provider]  naproxen sodium (ALEVE) 220 MG tablet Take 220-440 mg by mouth 2 (two) times daily as needed (for pain or headaches).   Yes [provider]  omeprazole (PRILOSEC) 40 MG capsule Take 40 mg by mouth daily before breakfast.   Yes [provider]  tadalafil (CIALIS) 5 MG tablet Take 5 mg by mouth daily. 01/08/21  Yes [provider]  tamsulosin (FLOMAX) 0.4 MG CAPS capsule Take 0.4 mg by mouth in the morning. 02/26/21  Yes [provider]  doxycycline (VIBRAMYCIN) 100 MG capsule Take 1 capsule (100 mg total) by mouth 2 (two) times daily. Patient not  taking: Reported on 03/13/2021 04/02/13   Piepenbrink, Anderson Malta, PA-C  Lactobacillus Rhamnosus, GG, ([employer] PROBIOTIC, LACTOBACILLUS,) CAPS Take 1 tablet by mouth daily. Patient not taking: Reported on 03/13/2021 04/02/13   Piepenbrink, Anderson Malta, PA-C  ondansetron (ZOFRAN ODT) 4 MG disintegrating tablet Take 1 tablet (4 mg total) by mouth every 8 (eight) hours as needed for nausea or vomiting. Patient not taking: Reported on 03/13/2021 04/02/13   Piepenbrink, Anderson Malta, PA-C  oxyCODONE-acetaminophen  (PERCOCET) 5-325 MG per tablet Take 1-2 tablets by mouth every 6 (six) hours as needed for severe pain. Patient not taking: Reported on 03/13/2021 04/02/13   Baron Sane, PA-C    Physical Exam: Vitals:   03/13/21 1624 03/13/21 1625 03/13/21 1741 03/13/21 1930  BP: (!) 159/80  137/80 126/73  Pulse: 73  62 (!) 57  Resp: 20  14 14   Temp: 97.9 F (36.6 C)  (!) 100.9 F (38.3 C)   TempSrc: Oral  Oral   SpO2: 95%  97% 98%  Weight:  79.4 kg    Height:  5\' 7"  (1.702 m)      Constitutional: NAD, calm, comfortable, nontoxic jaundiced appearing elderly male laying flat in bed Vitals:   03/13/21 1624 03/13/21 1625 03/13/21 1741 03/13/21 1930  BP: (!) 159/80  137/80 126/73  Pulse: 73  62 (!) 57  Resp: 20  14 14   Temp: 97.9 F (36.6 C)  (!) 100.9 F (38.3 C)   TempSrc: Oral  Oral   SpO2: 95%  97% 98%  Weight:  79.4 kg    Height:  5\' 7"  (1.702 m)     Eyes: Bilateral scleral icterus ENMT: Mucous membranes are moist.  Neck: normal, supple Respiratory: clear to auscultation bilaterally, no wheezing, no crackles. Normal respiratory effort.  Cardiovascular: Regular rate and rhythm, no murmurs / rubs / gallops. No extremity edema.  Abdomen: Soft but distended abdomen without tenderness, no masses palpated. Bowel sounds positive.  Musculoskeletal: no clubbing / cyanosis. No joint deformity upper and lower extremities. Good ROM, no contractures. Normal muscle tone.  Skin: no rashes, lesions, ulcers. Neurologic: CN 2-12 grossly intact. Strength 5/5 in all 4.  Psychiatric: Normal judgment and insight. Alert and oriented x 3. Normal mood.     Labs on Admission: I have personally reviewed following labs and imaging studies  CBC: Recent Labs  Lab 03/13/21 1643  WBC 10.5  NEUTROABS 8.8*  HGB 15.6  HCT 44.7  MCV 88.9  PLT 858   Basic Metabolic Panel: Recent Labs  Lab 03/13/21 1643  NA 137  K 3.6  CL 106  CO2 23  GLUCOSE 179*  BUN 14  CREATININE 0.76  CALCIUM 9.2    GFR: Estimated Creatinine Clearance: 94.2 mL/min (by C-G formula based on SCr of 0.76 mg/dL). Liver Function Tests: Recent Labs  Lab 03/13/21 1643  AST 231*  ALT 435*  ALKPHOS 184*  BILITOT 6.8*  PROT 7.2  ALBUMIN 4.2   Recent Labs  Lab 03/13/21 1643  LIPASE 35   No results for input(s): AMMONIA in the last 168 hours. Coagulation Profile: No results for input(s): INR, PROTIME in the last 168 hours. Cardiac Enzymes: No results for input(s): CKTOTAL, CKMB, CKMBINDEX, TROPONINI in the last 168 hours. BNP (last 3 results) No results for input(s): PROBNP in the last 8760 hours. HbA1C: No results for input(s): HGBA1C in the last 72 hours. CBG: No results for input(s): GLUCAP in the last 168 hours. Lipid Profile: No results for input(s): CHOL, HDL, LDLCALC, TRIG, CHOLHDL, LDLDIRECT in  the last 72 hours. Thyroid Function Tests: No results for input(s): TSH, T4TOTAL, FREET4, T3FREE, THYROIDAB in the last 72 hours. Anemia Panel: No results for input(s): VITAMINB12, FOLATE, FERRITIN, TIBC, IRON, RETICCTPCT in the last 72 hours. Urine analysis:    Component Value Date/Time   COLORURINE AMBER (A) 03/13/2021 1643   APPEARANCEUR CLEAR 03/13/2021 1643   LABSPEC >1.046 (H) 03/13/2021 1643   PHURINE 6.0 03/13/2021 1643   GLUCOSEU NEGATIVE 03/13/2021 1643   HGBUR NEGATIVE 03/13/2021 1643   BILIRUBINUR SMALL (A) 03/13/2021 1643   KETONESUR NEGATIVE 03/13/2021 1643   PROTEINUR NEGATIVE 03/13/2021 1643   UROBILINOGEN 1.0 04/02/2013 1627   NITRITE NEGATIVE 03/13/2021 1643   LEUKOCYTESUR NEGATIVE 03/13/2021 1643    Radiological Exams on Admission: CT ABDOMEN PELVIS W CONTRAST  Result Date: 03/13/2021 CLINICAL DATA:  Acute abdominal pain.  Right upper quadrant pain. EXAM: CT ABDOMEN AND PELVIS WITH CONTRAST TECHNIQUE: Multidetector CT imaging of the abdomen and pelvis was performed using the standard protocol following bolus administration of intravenous contrast. RADIATION DOSE  REDUCTION: This exam was performed according to the departmental dose-optimization program which includes automated exposure control, adjustment of the mA and/or kV according to patient size and/or use of iterative reconstruction technique. CONTRAST:  146mL OMNIPAQUE IOHEXOL 350 MG/ML SOLN COMPARISON:  Ultrasound abdomen 03/13/2021. FINDINGS: Lower chest: No acute abnormality. Hepatobiliary: Gallbladder sludge is present. There are 2 small echogenic foci within the distal common bile duct at the level of the pancreatic head suspicious for choledocholithiasis. Common bile duct measures 1 cm as seen on prior ultrasound. No focal liver lesions are identified. No pericholecystic inflammation. Pancreas: Unremarkable. No pancreatic ductal dilatation or surrounding inflammatory changes. Spleen: Normal in size without focal abnormality. Adrenals/Urinary Tract: Adrenal glands are unremarkable. Kidneys are normal, without renal calculi, focal lesion, or hydronephrosis. Bladder is unremarkable. Stomach/Bowel: Stomach is within normal limits. Appendix is surgically absent. No evidence of bowel wall thickening, distention, or inflammatory changes. There is colonic diverticulosis without evidence for acute diverticulitis. Vascular/Lymphatic: Aortic atherosclerosis. No enlarged abdominal or pelvic lymph nodes. Reproductive: Prostate gland is enlarged. Other: No abdominal wall hernia or abnormality. No abdominopelvic ascites. Musculoskeletal: No acute or significant osseous findings. IMPRESSION: 1. Dilated common bile duct with findings suspicious for choledocholithiasis. Other common bile duct lesions not excluded. This can be further evaluated with ERCP or MRCP as clinically warranted. 2. Gallbladder sludge. 3. Colonic diverticulosis. 4.  Aortic Atherosclerosis (ICD10-I70.0). 5. Prostatomegaly. Electronically Signed   By: Ronney Asters M.D.   On: 03/13/2021 19:54   US Abdomen Limited RUQ (LIVER/GB)  Result Date:  03/13/2021 CLINICAL DATA:  Right upper quadrant abdominal pain. EXAM: ULTRASOUND ABDOMEN LIMITED RIGHT UPPER QUADRANT COMPARISON:  Abdominal ultrasound 03/09/2021. FINDINGS: Gallbladder: Gallbladder sludge is seen. Gallbladder wall is upper limits of normal in thickness. No gallstones. No pericholecystic fluid. No sonographic Murphy sign noted by sonographer. Common bile duct: Diameter: 1.0 cm Liver: No focal lesion identified. Increase in parenchymal echogenicity. Portal vein is patent on color Doppler imaging with normal direction of blood flow towards the liver. Other: None. IMPRESSION: 1. There is new dilatation of the common bile duct measuring 1 cm concerning for distal biliary obstruction. 2. Gallbladder sludge. 3. Echogenic liver likely related to fatty infiltration. Electronically Signed   By: Ronney Asters M.D.   On: 03/13/2021 17:13      Assessment/Plan  Cholangitis with choledocholithiasis -Continue IV Rocephin.  Flagyl was initially added but patient developed allergic reaction shortly after.   -Keep n.p.o. after midnight.  [facility] Dr. Therisa Doyne consulted and GI to take for ERCP in the morning. -need general surgery consult in morning  Elevated LFT - Secondary to choledocholithiasis.  Continue to monitor following ERCP  Allergic reaction Pt developed hives shortly after metronidazole.  This was discontinued with IV Benadryl given.  Medication was added to allergy list.  GERD - Continue PPI  BPH - Continue tamsulosin  OSA Continue CPAP  DVT prophylaxis:.SCD Code Status: Full Family Communication: Plan discussed with patient and wife at bedside  disposition Plan: Home with at least 2 midnight stays  Consults called: [facility] Admission status: inpatient  Level of care: Med-Surg  Status is: Inpatient Remains inpatient appropriate because: Admit - It is my clinical opinion that admission to INPATIENT is reasonable and necessary because this patient will require at least 2  midnights in the hospital to treat this condition based on the medical complexity of the problems presented.  Given the aforementioned information, the predictability of an adverse outcome is felt to be significant.  Planned Discharge Destination: [place] T Bhavin Monjaraz DO [facility]   If 7PM-7AM, please contact night-coverage [email or web address]   03/13/2021, 9:09 PM

## 2021-03-13 NOTE — ED Triage Notes (Signed)
"  Went to urgent care today for upper abdominal pain that started yesterday & vomiting x 1 this morning. They did blood work and my bilirubin was elevated again so they told me to come to the hospital" per pt

## 2021-03-13 NOTE — ED Notes (Signed)
Patient developed hives on chest following Flagyl admin. MD made aware. IV benadryl ordered. Pt does have hx of recent hives

## 2021-03-13 NOTE — ED Provider Triage Note (Signed)
Emergency Medicine Provider Triage Evaluation Note  Anthony Schneider , a 66 y.o. male  was evaluated in triage.  Pt complains of right upper quadrant abdominal pain and epigastric abdominal pain.  Has been having this on and off for the last 4 weeks.  Had extensive outpatient work-up including right upper quadrant ultrasound which showed sludge and hepatitis panel which is negative.  Patient had pizza last night and approximately woke up around 3 AM and had excruciating abdominal pain with 1 episode of vomiting.  Intermittent subjective fevers.  Review of Systems  Positive:  Negative: See above   Physical Exam  BP (!) 159/80 (BP Location: Left Arm)    Pulse 73    Temp 97.9 F (36.6 C) (Oral)    Resp 20    Ht 5\' 7"  (1.702 m)    Wt 79.4 kg    SpO2 95%    BMI 27.41 kg/m  Gen:   Awake, no distress   Resp:  Normal effort  MSK:   Moves extremities without difficulty  Other:  Tenderness in the right upper quadrant.  Medical Decision Making  Medically screening exam initiated at 4:35 PM.  Appropriate orders placed.  Anthony Schneider was informed that the remainder of the evaluation will be completed by another provider, this initial triage assessment does not replace that evaluation, and the importance of remaining in the ED until their evaluation is complete.     Myna Bright [place], [place] 03/13/21 1636

## 2021-03-14 ENCOUNTER — Inpatient Hospital Stay (HOSPITAL_COMMUNITY): Payer: BC Managed Care – PPO | Admitting: Anesthesiology

## 2021-03-14 ENCOUNTER — Inpatient Hospital Stay (HOSPITAL_COMMUNITY): Payer: BC Managed Care – PPO

## 2021-03-14 ENCOUNTER — Encounter (HOSPITAL_COMMUNITY)
Admission: EM | Disposition: A | Discharge status: Home / Self Care | Payer: Self-pay | Source: Home / Self Care | Attending: Internal Medicine

## 2021-03-14 ENCOUNTER — Encounter (HOSPITAL_COMMUNITY): Payer: Self-pay | Admitting: Family Medicine

## 2021-03-14 DIAGNOSIS — G4733 Obstructive sleep apnea (adult) (pediatric): Secondary | ICD-10-CM

## 2021-03-14 DIAGNOSIS — T7840XA Allergy, unspecified, initial encounter: Secondary | ICD-10-CM

## 2021-03-14 DIAGNOSIS — K219 Gastro-esophageal reflux disease without esophagitis: Secondary | ICD-10-CM

## 2021-03-14 DIAGNOSIS — R7989 Other specified abnormal findings of blood chemistry: Secondary | ICD-10-CM | POA: Insufficient documentation

## 2021-03-14 DIAGNOSIS — N400 Enlarged prostate without lower urinary tract symptoms: Secondary | ICD-10-CM

## 2021-03-14 DIAGNOSIS — K8309 Other cholangitis: Secondary | ICD-10-CM | POA: Diagnosis not present

## 2021-03-14 DIAGNOSIS — L509 Urticaria, unspecified: Secondary | ICD-10-CM

## 2021-03-14 HISTORY — PX: REMOVAL OF STONES: SHX5545

## 2021-03-14 HISTORY — PX: SPHINCTEROTOMY: SHX5544

## 2021-03-14 HISTORY — PX: ERCP: SHX5425

## 2021-03-14 LAB — CBC
HCT: 41.6 % (ref 39.0–52.0)
Hemoglobin: 14.3 g/dL (ref 13.0–17.0)
MCH: 30.8 pg (ref 26.0–34.0)
MCHC: 34.4 g/dL (ref 30.0–36.0)
MCV: 89.5 fL (ref 80.0–100.0)
Platelets: 213 10*3/uL (ref 150–400)
RBC: 4.65 MIL/uL (ref 4.22–5.81)
RDW: 13.2 % (ref 11.5–15.5)
WBC: 6 10*3/uL (ref 4.0–10.5)
nRBC: 0 % (ref 0.0–0.2)

## 2021-03-14 LAB — COMPREHENSIVE METABOLIC PANEL
ALT: 349 U/L — ABNORMAL HIGH (ref 0–44)
AST: 181 U/L — ABNORMAL HIGH (ref 15–41)
Albumin: 3.4 g/dL — ABNORMAL LOW (ref 3.5–5.0)
Alkaline Phosphatase: 166 U/L — ABNORMAL HIGH (ref 38–126)
Anion gap: 6 (ref 5–15)
BUN: 11 mg/dL (ref 8–23)
CO2: 26 mmol/L (ref 22–32)
Calcium: 8.8 mg/dL — ABNORMAL LOW (ref 8.9–10.3)
Chloride: 105 mmol/L (ref 98–111)
Creatinine, Ser: 0.7 mg/dL (ref 0.61–1.24)
GFR, Estimated: >60 mL/min (ref >60)
Glucose, Bld: 124 mg/dL — ABNORMAL HIGH (ref 70–99)
Potassium: 3.7 mmol/L (ref 3.5–5.1)
Sodium: 137 mmol/L (ref 135–145)
Total Bilirubin: 10.4 mg/dL — ABNORMAL HIGH (ref 0.3–1.2)
Total Protein: 6.3 g/dL — ABNORMAL LOW (ref 6.5–8.1)

## 2021-03-14 LAB — RESP PANEL BY RT-PCR (FLU A&B, COVID) ARPGX2
Influenza A by PCR: NEGATIVE
Influenza B by PCR: NEGATIVE
SARS Coronavirus 2 by RT PCR: NEGATIVE

## 2021-03-14 SURGERY — ERCP, WITH INTERVENTION IF INDICATED
Anesthesia: General

## 2021-03-14 MED ORDER — PROPOFOL 10 MG/ML IV BOLUS
INTRAVENOUS | Status: AC
  Filled 2021-03-14: qty 20

## 2021-03-14 MED ORDER — DEXAMETHASONE SODIUM PHOSPHATE 10 MG/ML IJ SOLN
INTRAMUSCULAR | Status: DC | PRN
  Administered 2021-03-14: 10 mg via INTRAVENOUS

## 2021-03-14 MED ORDER — SUGAMMADEX SODIUM 200 MG/2ML IV SOLN
INTRAVENOUS | Status: DC | PRN
  Administered 2021-03-14: 400 mg via INTRAVENOUS

## 2021-03-14 MED ORDER — ROCURONIUM BROMIDE 10 MG/ML (PF) SYRINGE
PREFILLED_SYRINGE | INTRAVENOUS | Status: DC | PRN
  Administered 2021-03-14: 60 mg via INTRAVENOUS

## 2021-03-14 MED ORDER — FENTANYL CITRATE (PF) 250 MCG/5ML IJ SOLN
INTRAMUSCULAR | Status: DC | PRN
  Administered 2021-03-14 (×2): 50 ug via INTRAVENOUS

## 2021-03-14 MED ORDER — GLUCAGON HCL RDNA (DIAGNOSTIC) 1 MG IJ SOLR
INTRAMUSCULAR | Status: AC
  Filled 2021-03-14: qty 2

## 2021-03-14 MED ORDER — TAMSULOSIN HCL 0.4 MG PO CAPS
0.4000 mg | ORAL_CAPSULE | Freq: Every morning | ORAL | Status: DC
  Filled 2021-03-14: qty 1

## 2021-03-14 MED ORDER — DIPHENHYDRAMINE HCL 50 MG/ML IJ SOLN
12.5000 mg | Freq: Once | INTRAMUSCULAR | Status: AC
  Administered 2021-03-14: 12.5 mg via INTRAVENOUS
  Filled 2021-03-14: qty 1

## 2021-03-14 MED ORDER — MIDAZOLAM HCL 5 MG/5ML IJ SOLN
INTRAMUSCULAR | Status: DC | PRN
  Administered 2021-03-14: 2 mg via INTRAVENOUS

## 2021-03-14 MED ORDER — FENTANYL CITRATE (PF) 100 MCG/2ML IJ SOLN
INTRAMUSCULAR | Status: AC
  Filled 2021-03-14: qty 2

## 2021-03-14 MED ORDER — INDOMETHACIN 50 MG RE SUPP
RECTAL | Status: AC
  Filled 2021-03-14: qty 2

## 2021-03-14 MED ORDER — LACTATED RINGERS IV SOLN
INTRAVENOUS | Status: AC | PRN
  Administered 2021-03-14: 1000 mL via INTRAVENOUS

## 2021-03-14 MED ORDER — PANTOPRAZOLE SODIUM 40 MG PO TBEC: 40.0000 mg | DELAYED_RELEASE_TABLET | Freq: Every day | ORAL | Status: DC

## 2021-03-14 MED ORDER — PROPOFOL 10 MG/ML IV BOLUS
INTRAVENOUS | Status: DC | PRN
  Administered 2021-03-14: 170 mg via INTRAVENOUS

## 2021-03-14 MED ORDER — ONDANSETRON HCL 4 MG/2ML IJ SOLN
INTRAMUSCULAR | Status: DC | PRN
  Administered 2021-03-14: 4 mg via INTRAVENOUS

## 2021-03-14 MED ORDER — LACTATED RINGERS IV SOLN: INTRAVENOUS | Status: DC

## 2021-03-14 MED ORDER — MIDAZOLAM HCL 2 MG/2ML IJ SOLN
INTRAMUSCULAR | Status: AC
  Filled 2021-03-14: qty 2

## 2021-03-14 MED ORDER — LIDOCAINE 2% (20 MG/ML) 5 ML SYRINGE
INTRAMUSCULAR | Status: DC | PRN
  Administered 2021-03-14: 80 mg via INTRAVENOUS

## 2021-03-14 NOTE — Consult Note (Signed)
Referring Provider: [facility] Primary Care Physician:  Lujean Amel, MD Primary Gastroenterologist:  Dr. Therisa Doyne  Reason for Consultation: choledocholithiasis  HPI: Anthony Schneider is a 66 y.o. male with no significant medical history presented to the emergency room with jaundice fever and abdominal pain.    In December patient had low back pain dark urine presented to primary care was evaluated urinalysis and diagnosed with UTI also noted elevated liver enzymes at that time.  2 weeks ago patient began having generalized upper abdominal pain, intermittent low-grade fever averaging around 100, pruritus, Coca-[employer]a urine, clay colored stools and presented to [facility] walk-in clinic for further evaluation.  Ultrasound done outpatient showing gallbladder sludge, no cholelithiasis.  Patient continued to have fever was recommended to present to the emergency room.  Drinks 2 beers a day last drink 2 weeks ago.  Patient's mother history of colon cancer in early 58s. States occasional aspirin use. History of appendectomy  Last EGD 08/19/2018 No endoscopic esophageal abnormalities to explain dysphagia Erythematous mucosa in the antrum biopsied, reactive gastropathy negative for H. pylori Normal duodenum  Last colonoscopy 11/04/2016 Multiple diverticula throughout colon Single 5 mm tubular adenoma in the transverse colon Single 5 mm tubular adeoma in the descending colon recall 5 years  Past Medical History:  Diagnosis Date   Hypogonadism male     Past Surgical History:  Procedure Laterality Date   PROSTATE BIOPSY     TONSILLECTOMY AND ADENOIDECTOMY      Prior to Admission medications   Medication Sig Start Date End Date Taking? Authorizing Provider  EPINEPHrine 0.3 mg/0.3 mL IJ SOAJ injection Inject 0.3 mLs (0.3 mg total) into the muscle once. Patient taking differently: Inject 0.3 mg into the muscle once as needed for anaphylaxis. 07/04/14  Yes Sciacca, Marissa, PA-C  ibuprofen (ADVIL) 200 MG  tablet Take 200-400 mg by mouth every 6 (six) hours as needed for mild pain or headache.   Yes [provider]  loratadine (CLARITIN) 10 MG tablet Take 10 mg by mouth daily as needed for allergies.   Yes [provider]  naproxen sodium (ALEVE) 220 MG tablet Take 220-440 mg by mouth 2 (two) times daily as needed (for pain or headaches).   Yes [provider]  omeprazole (PRILOSEC) 40 MG capsule Take 40 mg by mouth daily before breakfast.   Yes [provider]  tadalafil (CIALIS) 5 MG tablet Take 5 mg by mouth daily. 01/08/21  Yes [provider]  tamsulosin (FLOMAX) 0.4 MG CAPS capsule Take 0.4 mg by mouth in the morning. 02/26/21  Yes [provider]  doxycycline (VIBRAMYCIN) 100 MG capsule Take 1 capsule (100 mg total) by mouth 2 (two) times daily. Patient not taking: Reported on 03/13/2021 04/02/13   Piepenbrink, Anderson Malta, PA-C  Lactobacillus Rhamnosus, GG, ([employer] PROBIOTIC, LACTOBACILLUS,) CAPS Take 1 tablet by mouth daily. Patient not taking: Reported on 03/13/2021 04/02/13   Piepenbrink, Anderson Malta, PA-C  ondansetron (ZOFRAN ODT) 4 MG disintegrating tablet Take 1 tablet (4 mg total) by mouth every 8 (eight) hours as needed for nausea or vomiting. Patient not taking: Reported on 03/13/2021 04/02/13   Piepenbrink, Anderson Malta, PA-C  oxyCODONE-acetaminophen (PERCOCET) 5-325 MG per tablet Take 1-2 tablets by mouth every 6 (six) hours as needed for severe pain. Patient not taking: Reported on 03/13/2021 04/02/13   Baron Sane, PA-C    Scheduled Meds:  pantoprazole  40 mg Oral Daily   tamsulosin  0.4 mg Oral q AM   Continuous Infusions:  cefTRIAXone (ROCEPHIN)  IV  lactated ringers 125 mL/hr at 03/13/21 2050   PRN Meds:.morphine injection  Allergies as of 03/13/2021 - Review Complete 03/13/2021  Allergen Reaction Noted   Flagyl [metronidazole] Hives 03/13/2021   Penicillin g Anaphylaxis 03/12/2021    History reviewed. No pertinent  family history.  Social History   Socioeconomic History   Marital status: Married    Spouse name: Not on file   Number of children: Not on file   Years of education: Not on file   Highest education level: Not on file  Occupational History   Not on file  Tobacco Use   Smoking status: Never   Smokeless tobacco: Never  Substance and Sexual Activity   Alcohol use: Yes    Comment: occasionally   Drug use: No   Sexual activity: Not on file  Other Topics Concern   Not on file  Social History Narrative   Not on file   Social Determinants of Health   Financial Resource Strain: Not on file  Food Insecurity: Not on file  Transportation Needs: Not on file  Physical Activity: Not on file  Stress: Not on file  Social Connections: Not on file  Intimate Partner Violence: Not on file    Review of Systems: Review of Systems  Constitutional:  Positive for chills and fever.  HENT:  Negative for ear pain and nosebleeds.   Eyes:  Negative for blurred vision and double vision.  Respiratory:  Negative for cough and hemoptysis.   Cardiovascular:  Negative for chest pain and palpitations.  Gastrointestinal:  Positive for abdominal pain and nausea. Negative for blood in stool, constipation, diarrhea, heartburn, melena and vomiting.  Genitourinary:  Negative for dysuria, flank pain, frequency, hematuria and urgency.  Musculoskeletal:  Positive for back pain. Negative for myalgias and neck pain.  Skin:  Positive for itching. Negative for rash.  Neurological:  Negative for dizziness and headaches.  Endo/Heme/Allergies:  Negative for environmental allergies. Does not bruise/bleed easily.  Psychiatric/Behavioral:  Negative for depression and suicidal ideas.     Physical Exam: Physical Exam Constitutional:      General: He is not in acute distress.    Appearance: He is well-developed and normal weight. He is not ill-appearing, toxic-appearing or diaphoretic.  HENT:     Head: Normocephalic and  atraumatic.  Eyes:     General: Scleral icterus present.     Extraocular Movements: Extraocular movements intact.     Pupils: Pupils are equal, round, and reactive to light.  Cardiovascular:     Rate and Rhythm: Normal rate and regular rhythm.     Heart sounds: No murmur heard.   No friction rub. No gallop.  Pulmonary:     Effort: Pulmonary effort is normal. No respiratory distress.     Breath sounds: Normal breath sounds. No wheezing.  Abdominal:     General: Abdomen is flat. Bowel sounds are normal. There is no distension or abdominal bruit. There are no signs of injury.     Palpations: Abdomen is soft. There is no shifting dullness, fluid wave, hepatomegaly, splenomegaly, mass or pulsatile mass.     Tenderness: There is generalized abdominal tenderness and tenderness in the right upper quadrant and left upper quadrant. There is no right CVA tenderness, left CVA tenderness, guarding or rebound. Negative signs include Murphy's sign, Rovsing's sign, McBurney's sign, psoas sign and obturator sign.     Hernia: No hernia is present.  Musculoskeletal:        General: Normal range of motion.  Skin:  General: Skin is warm and dry.  Neurological:     General: No focal deficit present.     Mental Status: He is alert. Mental status is at baseline.  Psychiatric:        Mood and Affect: Mood normal.        Behavior: Behavior normal.    Vital signs: Vitals:   03/14/21 0234 03/14/21 0601  BP: 139/73 136/80  Pulse: 60 (!) 57  Resp: 14 16  Temp: 98.4 F (36.9 C) 98.1 F (36.7 C)  SpO2: 96% 97%   Last BM Date: 03/13/21    GI:  Lab Results: Recent Labs    03/13/21 1643 03/14/21 0426  WBC 10.5 6.0  HGB 15.6 14.3  HCT 44.7 41.6  PLT 254 213   BMET Recent Labs    03/13/21 1643 03/14/21 0426  NA 137 137  K 3.6 3.7  CL 106 105  CO2 23 26  GLUCOSE 179* 124*  BUN 14 11  CREATININE 0.76 0.70  CALCIUM 9.2 8.8*   LFT Recent Labs    03/14/21 0426  PROT 6.3*  ALBUMIN  3.4*  AST 181*  ALT 349*  ALKPHOS 166*  BILITOT 10.4*   PT/INR No results for input(s): LABPROT, INR in the last 72 hours.   Studies/Results: CT ABDOMEN PELVIS W CONTRAST  Result Date: 03/13/2021 CLINICAL DATA:  Acute abdominal pain.  Right upper quadrant pain. EXAM: CT ABDOMEN AND PELVIS WITH CONTRAST TECHNIQUE: Multidetector CT imaging of the abdomen and pelvis was performed using the standard protocol following bolus administration of intravenous contrast. RADIATION DOSE REDUCTION: This exam was performed according to the departmental dose-optimization program which includes automated exposure control, adjustment of the mA and/or kV according to patient size and/or use of iterative reconstruction technique. CONTRAST:  168m OMNIPAQUE IOHEXOL 350 MG/ML SOLN COMPARISON:  Ultrasound abdomen 03/13/2021. FINDINGS: Lower chest: No acute abnormality. Hepatobiliary: Gallbladder sludge is present. There are 2 small echogenic foci within the distal common bile duct at the level of the pancreatic head suspicious for choledocholithiasis. Common bile duct measures 1 cm as seen on prior ultrasound. No focal liver lesions are identified. No pericholecystic inflammation. Pancreas: Unremarkable. No pancreatic ductal dilatation or surrounding inflammatory changes. Spleen: Normal in size without focal abnormality. Adrenals/Urinary Tract: Adrenal glands are unremarkable. Kidneys are normal, without renal calculi, focal lesion, or hydronephrosis. Bladder is unremarkable. Stomach/Bowel: Stomach is within normal limits. Appendix is surgically absent. No evidence of bowel wall thickening, distention, or inflammatory changes. There is colonic diverticulosis without evidence for acute diverticulitis. Vascular/Lymphatic: Aortic atherosclerosis. No enlarged abdominal or pelvic lymph nodes. Reproductive: Prostate gland is enlarged. Other: No abdominal wall hernia or abnormality. No abdominopelvic ascites. Musculoskeletal: No  acute or significant osseous findings. IMPRESSION: 1. Dilated common bile duct with findings suspicious for choledocholithiasis. Other common bile duct lesions not excluded. This can be further evaluated with ERCP or MRCP as clinically warranted. 2. Gallbladder sludge. 3. Colonic diverticulosis. 4.  Aortic Atherosclerosis (ICD10-I70.0). 5. Prostatomegaly. Electronically Signed   By: ARonney AstersM.D.   On: 03/13/2021 19:54   [place]domen Limited RUQ (LIVER/GB)  Result Date: 03/13/2021 CLINICAL DATA:  Right upper quadrant abdominal pain. EXAM: ULTRASOUND ABDOMEN LIMITED RIGHT UPPER QUADRANT COMPARISON:  Abdominal ultrasound 03/09/2021. FINDINGS: Gallbladder: Gallbladder sludge is seen. Gallbladder wall is upper limits of normal in thickness. No gallstones. No pericholecystic fluid. No sonographic Murphy sign noted by sonographer. Common bile duct: Diameter: 1.0 cm Liver: No focal lesion identified. Increase in parenchymal echogenicity. Portal vein  is patent on color Doppler imaging with normal direction of blood flow towards the liver. Other: None. IMPRESSION: 1. There is new dilatation of the common bile duct measuring 1 cm concerning for distal biliary obstruction. 2. Gallbladder sludge. 3. Echogenic liver likely related to fatty infiltration. Electronically Signed   By: Ronney Asters M.D.   On: 03/13/2021 17:13    Impression: Choledocholithiasis Elevated LFTs Fever, chills, pruritis and elevated bili and LFTs likely due to choledocholithiasis.  RUQ ultrasound 03/13/2021 Common bile duct measuring 1 cm. Bladder sludge Echogenic liver likely related to hepatic steatosis  CT abdomen pelvis 03/13/2021 Dilated common bile duct with findings suspicious for choledocholithiasis Gallbladder sludge Colonic diverticulosis  03/14/2021 AST 191, ALT 349, alk phos 166, bili total 10.4 WBC 6.0, hemoglobin 14.3 Vital stable today, afebrile  Plan: Plan for ERCP today or tomorrow. Discussed risks of ERCP with  the patient including possible pancreatitis, bleeding or perforation.  Patient n.p.o. at this time.  Post ERCP can order cholestyramine for pruritus if his symptoms persist.  Monitor LFTs Consider general surgery consult for possible cholecystectomy outpatient post ERCP.    LOS: 1 day   Charlott Rakes  PA-C 03/14/2021, 8:14 AM  Contact #  [phone]

## 2021-03-14 NOTE — Assessment & Plan Note (Addendum)
Monitor voiding, continue Flomax

## 2021-03-14 NOTE — Assessment & Plan Note (Addendum)
With choledocholithiasis, status post ERCP with stent removal and sphincterotomy 2/1.  Subsequently cholecystectomy 2/2.  Surgery informed that once able to tolerate diet patient will be discharged home and have outpatient follow-up.  Follow-up with PCP to check LFTs in a week.

## 2021-03-14 NOTE — Progress Notes (Signed)
Pt refused nocturnal cpap.  Pt was advised that RT is available all night should he change his mind.

## 2021-03-14 NOTE — Anesthesia Postprocedure Evaluation (Signed)
Anesthesia Post Note  Patient: Anthony Schneider  Procedure(s) Performed: ENDOSCOPIC RETROGRADE CHOLANGIOPANCREATOGRAPHY (ERCP) SPHINCTEROTOMY REMOVAL OF STONES     Patient location during evaluation: PACU Anesthesia Type: General Level of consciousness: awake and alert Pain management: pain level controlled Vital Signs Assessment: post-procedure vital signs reviewed and stable Respiratory status: spontaneous breathing, nonlabored ventilation, respiratory function stable and patient connected to nasal cannula oxygen Cardiovascular status: blood pressure returned to baseline and stable Postop Assessment: no apparent nausea or vomiting Anesthetic complications: no   No notable events documented.  Last Vitals:  Vitals:   03/14/21 1430 03/14/21 1435  BP: (!) 143/73   Pulse: (!) 58 (!) 59  Resp: 13 16  Temp:    SpO2: 94% 95%    Last Pain:  Vitals:   03/14/21 1421  TempSrc: Oral  PainSc: 0-No pain                 Jama Krichbaum

## 2021-03-14 NOTE — Assessment & Plan Note (Signed)
Patient had a high shortly after Flagyl and discontinued, received Benadryl..Monitor

## 2021-03-14 NOTE — Consult Note (Addendum)
Consult Note  [facility] [birth year]  [id number].    Requesting MD: Dr. Watt Climes Chief Complaint/Reason for Consult: Choledocholithiasis  HPI:  66 year old male with medical history significant for OSA, GERD, BPH who presented to [name] ED on 1/31 due to elevated total bilirubin found on lab work by PCP. Patient has had upper abdominal pain since December (initially treated for UTI) which worsened over the last 2 weeks with bloating, chills, and temperature of 100.3F. He developed nausea, emesis on date of presentation that was NBNB. He was admitted to hospitalist service and GI consulted.   Work up since admission has shown persistently elevated LFTs with T bili 10.4 today from 6.8 yesterday, AST/ALT 181/349 (231/435). WBC has remained within normal limits. CT and [place] 1/31 showed dilated CBD, gallbladder sludge.   ERCP today showed choledocholithiasis and general surgery asked to see in regard to cholecystectomy.   Substance use: occasional alcohol use Allergies: flagyl - hives, PCN - anaphylaxis Blood thinners: none Past Surgeries: laparoscopic appendectomy    ROS: Review of Systems  Constitutional:  Positive for chills. Negative for fever.  Respiratory:  Negative for cough, shortness of breath and wheezing.   Cardiovascular:  Negative for chest pain, palpitations and leg swelling.  Gastrointestinal:  Positive for abdominal pain, nausea and vomiting. Negative for constipation and diarrhea.  Genitourinary: Negative.   All other systems reviewed and are negative.  History reviewed. No pertinent family history.  Past Medical History:  Diagnosis Date   Hypogonadism male     Past Surgical History:  Procedure Laterality Date   PROSTATE BIOPSY     TONSILLECTOMY AND ADENOIDECTOMY      Social History:  reports that he has never smoked. He has never used smokeless tobacco. He reports current alcohol use. He reports that he does not use drugs.  Allergies:  Allergies   Allergen Reactions   Flagyl [Metronidazole] Hives   Penicillin G Anaphylaxis    Medications Prior to Admission  Medication Sig Dispense Refill   EPINEPHrine 0.3 mg/0.3 mL IJ SOAJ injection Inject 0.3 mLs (0.3 mg total) into the muscle once. (Patient taking differently: Inject 0.3 mg into the muscle once as needed for anaphylaxis.) 2 Device 0   ibuprofen (ADVIL) 200 MG tablet Take 200-400 mg by mouth every 6 (six) hours as needed for mild pain or headache.     loratadine (CLARITIN) 10 MG tablet Take 10 mg by mouth daily as needed for allergies.     naproxen sodium (ALEVE) 220 MG tablet Take 220-440 mg by mouth 2 (two) times daily as needed (for pain or headaches).     omeprazole (PRILOSEC) 40 MG capsule Take 40 mg by mouth daily before breakfast.     tadalafil (CIALIS) 5 MG tablet Take 5 mg by mouth daily.     tamsulosin (FLOMAX) 0.4 MG CAPS capsule Take 0.4 mg by mouth in the morning.     doxycycline (VIBRAMYCIN) 100 MG capsule Take 1 capsule (100 mg total) by mouth 2 (two) times daily. (Patient not taking: Reported on 03/13/2021) 20 capsule 0   Lactobacillus Rhamnosus, GG, ([employer] PROBIOTIC, LACTOBACILLUS,) CAPS Take 1 tablet by mouth daily. (Patient not taking: Reported on 03/13/2021) 30 capsule 0   ondansetron (ZOFRAN ODT) 4 MG disintegrating tablet Take 1 tablet (4 mg total) by mouth every 8 (eight) hours as needed for nausea or vomiting. (Patient not taking: Reported on 03/13/2021) 10 tablet 0   oxyCODONE-acetaminophen (PERCOCET) 5-325 MG per tablet Take  1-2 tablets by mouth every 6 (six) hours as needed for severe pain. (Patient not taking: Reported on 03/13/2021) 20 tablet 0    Blood pressure (!) 141/69, pulse (!) 59, temperature 97.8 F (36.6 C), temperature source Oral, resp. rate 15, height 5\' 7"  (1.702 m), weight 79.4 kg, SpO2 96 %. Physical Exam: General: pleasant, WD, male who is laying in bed in NAD HEENT: head is normocephalic, atraumatic.  Sclera are mildly icteric.  Pupils equal  and round. EOMs intact.  Ears and nose without any masses or lesions.  Mouth is pink and moist Heart: regular, rate, and rhythm.  Normal s1,s2. No obvious murmurs, gallops, or rubs noted.  Palpable radial and pedal pulses bilaterally Lungs: CTAB, no wheezes, rhonchi, or rales noted.  Respiratory effort nonlabored Abd: soft, ND, +BS, no masses, hernias, or organomegaly. Mild ttp in epigastric abdomen, no RUQ ttp  MSK: all 4 extremities are symmetrical with no cyanosis, clubbing, or edema. Skin: warm and dry with jaundice Neuro: Cranial nerves 2-12 grossly intact, sensation is normal throughout Psych: A&Ox3 with an appropriate affect.    Results for orders placed or performed during the hospital encounter of 03/13/21 (from the past 48 hour(s))  CBC with Differential     Status: Abnormal   Collection Time: 03/13/21  4:43 PM  Result Value Ref Range   WBC 10.5 4.0 - 10.5 K/uL   RBC 5.03 4.22 - 5.81 MIL/uL   Hemoglobin 15.6 13.0 - 17.0 g/dL   HCT 44.7 39.0 - 52.0 %   MCV 88.9 80.0 - 100.0 fL   MCH 31.0 26.0 - 34.0 pg   MCHC 34.9 30.0 - 36.0 g/dL   RDW 13.2 11.5 - 15.5 %   Platelets 254 150 - 400 K/uL   nRBC 0.0 0.0 - 0.2 %   Neutrophils Relative % 85 %   Neutro Abs 8.8 (H) 1.7 - 7.7 K/uL   Lymphocytes Relative 8 %   Lymphs Abs 0.9 0.7 - 4.0 K/uL   Monocytes Relative 6 %   Monocytes Absolute 0.7 0.1 - 1.0 K/uL   Eosinophils Relative 0 %   Eosinophils Absolute 0.0 0.0 - 0.5 K/uL   Basophils Relative 1 %   Basophils Absolute 0.1 0.0 - 0.1 K/uL   Immature Granulocytes 0 %   Abs Immature Granulocytes 0.03 0.00 - 0.07 K/uL    Comment: Performed at [facility], [place] [address], [place], [place] [address]  Comprehensive metabolic panel     Status: Abnormal   Collection Time: 03/13/21  4:43 PM  Result Value Ref Range   Sodium 137 135 - 145 mmol/L   Potassium 3.6 3.5 - 5.1 mmol/L   Chloride 106 98 - 111 mmol/L   CO2 23 22 - 32 mmol/L   Glucose, Bld 179 (H) 70 - 99 mg/dL     Comment: Glucose reference range applies only to samples taken after fasting for at least 8 hours.   BUN 14 8 - 23 mg/dL   Creatinine, Ser 0.76 0.61 - 1.24 mg/dL   Calcium 9.2 8.9 - 10.3 mg/dL   Total Protein 7.2 6.5 - 8.1 g/dL   Albumin 4.2 3.5 - 5.0 g/dL   AST 231 (H) 15 - 41 U/L   ALT 435 (H) 0 - 44 U/L   Alkaline Phosphatase 184 (H) 38 - 126 U/L   Total Bilirubin 6.8 (H) 0.3 - 1.2 mg/dL   GFR, Estimated >60 >60 mL/min    Comment: (NOTE) Calculated using the CKD-EPI  Creatinine Equation (2021)    Anion gap 8 5 - 15    Comment: Performed at [facility], [place] [address] [place], [place] [address]  Lipase, blood     Status: None   Collection Time: 03/13/21  4:43 PM  Result Value Ref Range   Lipase 35 11 - 51 U/L    Comment: Performed at [facility], [place] [address] [place], [place] [address]  Urinalysis, Routine w reflex microscopic Urine, Clean Catch     Status: Abnormal   Collection Time: 03/13/21  4:43 PM  Result Value Ref Range   Color, Urine AMBER (A) YELLOW    Comment: BIOCHEMICALS MAY BE AFFECTED BY COLOR   APPearance CLEAR CLEAR   Specific Gravity, Urine >1.046 (H) 1.005 - 1.030   pH 6.0 5.0 - 8.0   Glucose, UA NEGATIVE NEGATIVE mg/dL   Hgb urine dipstick NEGATIVE NEGATIVE   Bilirubin Urine SMALL (A) NEGATIVE   Ketones, ur NEGATIVE NEGATIVE mg/dL   Protein, ur NEGATIVE NEGATIVE mg/dL   Nitrite NEGATIVE NEGATIVE   Leukocytes,Ua NEGATIVE NEGATIVE    Comment: Performed at [place] [address] [place], [place] [address]  Comprehensive metabolic panel     Status: Abnormal   Collection Time: 03/14/21  4:26 AM  Result Value Ref Range   Sodium 137 135 - 145 mmol/L   Potassium 3.7 3.5 - 5.1 mmol/L   Chloride 105 98 - 111 mmol/L   CO2 26 22 - 32 mmol/L   Glucose, Bld 124 (H) 70 - 99 mg/dL    Comment: Glucose reference range applies only to samples taken after fasting for at least 8 hours.   BUN 11 8 -  23 mg/dL   Creatinine, Ser 0.70 0.61 - 1.24 mg/dL   Calcium 8.8 (L) 8.9 - 10.3 mg/dL   Total Protein 6.3 (L) 6.5 - 8.1 g/dL   Albumin 3.4 (L) 3.5 - 5.0 g/dL   AST 181 (H) 15 - 41 U/L   ALT 349 (H) 0 - 44 U/L   Alkaline Phosphatase 166 (H) 38 - 126 U/L   Total Bilirubin 10.4 (H) 0.3 - 1.2 mg/dL   GFR, Estimated >60 >60 mL/min    Comment: (NOTE) Calculated using the CKD-EPI Creatinine Equation (2021)    Anion gap 6 5 - 15    Comment: Performed at [facility], [place] [address], [place],  [address]  CBC     Status: None   Collection Time: 03/14/21  4:26 AM  Result Value Ref Range   WBC 6.0 4.0 - 10.5 K/uL   RBC 4.65 4.22 - 5.81 MIL/uL   Hemoglobin 14.3 13.0 - 17.0 g/dL   HCT 41.6 39.0 - 52.0 %   MCV 89.5 80.0 - 100.0 fL   MCH 30.8 26.0 - 34.0 pg   MCHC 34.4 30.0 - 36.0 g/dL   RDW 13.2 11.5 - 15.5 %   Platelets 213 150 - 400 K/uL   nRBC 0.0 0.0 - 0.2 %    Comment: Performed at [facility], [place] [address], [place],  [address]   CT ABDOMEN PELVIS W CONTRAST  Result Date: 03/13/2021 CLINICAL DATA:  Acute abdominal pain.  Right upper quadrant pain. EXAM: CT ABDOMEN AND PELVIS WITH CONTRAST TECHNIQUE: Multidetector CT imaging of the abdomen and pelvis was performed using the standard protocol following bolus administration of intravenous contrast. RADIATION DOSE REDUCTION: This exam was performed according to the departmental dose-optimization program which  includes automated exposure control, adjustment of the mA and/or kV according to patient size and/or use of iterative reconstruction technique. CONTRAST:  129mL OMNIPAQUE IOHEXOL 350 MG/ML SOLN COMPARISON:  Ultrasound abdomen 03/13/2021. FINDINGS: Lower chest: No acute abnormality. Hepatobiliary: Gallbladder sludge is present. There are 2 small echogenic foci within the distal common bile duct at the level of the pancreatic head suspicious for choledocholithiasis. Common bile duct measures  1 cm as seen on prior ultrasound. No focal liver lesions are identified. No pericholecystic inflammation. Pancreas: Unremarkable. No pancreatic ductal dilatation or surrounding inflammatory changes. Spleen: Normal in size without focal abnormality. Adrenals/Urinary Tract: Adrenal glands are unremarkable. Kidneys are normal, without renal calculi, focal lesion, or hydronephrosis. Bladder is unremarkable. Stomach/Bowel: Stomach is within normal limits. Appendix is surgically absent. No evidence of bowel wall thickening, distention, or inflammatory changes. There is colonic diverticulosis without evidence for acute diverticulitis. Vascular/Lymphatic: Aortic atherosclerosis. No enlarged abdominal or pelvic lymph nodes. Reproductive: Prostate gland is enlarged. Other: No abdominal wall hernia or abnormality. No abdominopelvic ascites. Musculoskeletal: No acute or significant osseous findings. IMPRESSION: 1. Dilated common bile duct with findings suspicious for choledocholithiasis. Other common bile duct lesions not excluded. This can be further evaluated with ERCP or MRCP as clinically warranted. 2. Gallbladder sludge. 3. Colonic diverticulosis. 4.  Aortic Atherosclerosis (ICD10-I70.0). 5. Prostatomegaly. Electronically Signed   By: Ronney Asters M.D.   On: 03/13/2021 19:54   DG ERCP WITH SPHINCTEROTOMY  Result Date: 03/14/2021 CLINICAL DATA:  ERCP with stone removal.  Jaundice. EXAM: ERCP TECHNIQUE: Multiple spot images obtained with the fluoroscopic device and submitted for interpretation post-procedure. FLUOROSCOPY TIME:  3 minutes, 48 seconds COMPARISON:  CT abdomen pelvis-03/13/2021 FINDINGS: Seven spot intraoperative fluoroscopic images of the right upper abdominal quadrant during ERCP are provided for review Initial image demonstrates an ERCP probe overlying the right upper abdominal quadrant. There is selective cannulation and opacification of the common bile duct which appears moderately dilated. There are  several persistent nonocclusive filling defects within the CBD (representative images 1 through 3) suggestive of choledocholithiasis as was questioned on preceding abdominal CT Subsequent images demonstrate insufflation of a balloon within the central aspect of the CBD with subsequent biliary sweeping and presumed stone extraction and sphincterotomy. IMPRESSION: ERCP with findings suggestive of choledocholithiasis with subsequent biliary sweeping, stone extraction and presumed sphincterotomy. These images were submitted for radiologic interpretation only. Please see the procedural report for the amount of contrast and the fluoroscopy time utilized. Electronically Signed   By: Sandi Mariscal M.D.   On: 03/14/2021 14:26   US Abdomen Limited RUQ (LIVER/GB)  Result Date: 03/13/2021 CLINICAL DATA:  Right upper quadrant abdominal pain. EXAM: ULTRASOUND ABDOMEN LIMITED RIGHT UPPER QUADRANT COMPARISON:  Abdominal ultrasound 03/09/2021. FINDINGS: Gallbladder: Gallbladder sludge is seen. Gallbladder wall is upper limits of normal in thickness. No gallstones. No pericholecystic fluid. No sonographic Murphy sign noted by sonographer. Common bile duct: Diameter: 1.0 cm Liver: No focal lesion identified. Increase in parenchymal echogenicity. Portal vein is patent on color Doppler imaging with normal direction of blood flow towards the liver. Other: None. IMPRESSION: 1. There is new dilatation of the common bile duct measuring 1 cm concerning for distal biliary obstruction. 2. Gallbladder sludge. 3. Echogenic liver likely related to fatty infiltration. Electronically Signed   By: Ronney Asters M.D.   On: 03/13/2021 17:13      Assessment/Plan Cholangitis Choledocholithiasis - CT and [place] on admission with dilated CBD and gallbladder sludge  - ERCP by Dr. Watt Climes 2/1 -  stones removed and biliary sphincterotomy was performed. - recommend laparoscopic cholecystectomy tomorrow pending OR availability  I have explained the  procedure, risks, and aftercare of cholecystectomy.  Risks include but are not limited to bleeding, infection, wound problems, anesthesia, diarrhea, bile leak, injury to common bile duct/liver/intestine.  He seems to understand and agrees to proceed.  FEN: NPO MN ID: Rocephin 1/31>> VTE: SCDs, okay for chemical prophylaxis from surgical perspective  I reviewed Consultant GI notes, last 24 h vitals and pain scores, last 48 h intake and output, last 24 h labs and trends, and last 24 h imaging results.  This care required moderate level of medical decision making.   Barkley Boards, [facility] Surgery 03/14/2021, 3:06 PM Please see Amion for pager number during day hours 7:00am-4:30pm

## 2021-03-14 NOTE — Hospital Course (Addendum)
With PMH of OSA, GERD, BPH who has been having upper abdominal pain and change in color of the urine back in December initially treated for UTI, past 2 weeks PTA having upper abdominal pain and bloating and mid to lower back pain, chills for several days and fever up to 100.2 and had nausea vomiting on 1/31.  Seen by PCP found to have abnormal LFTs sent to the ED for admission In the ED low-grade fever, AST is elevated to 231, ALT of 435, alkaline phosphatase of 184.  Total bilirubin was 6.8.UA with small bilirubin, negative nitrate or leukocyte, high specific gravity. CT of the abdomen pelvis shows dilated common bile duct with findings suspicious of choledocholithiasis. Other common bile duct lesions not excluded.  Gallbladder sludge. GI was consulted kept n.p.o. for possible ERCP placed on IV antibiotics and admitted for further management. Seen by Sadie Haber s/p ERCP- choledocholithiaiss found and removed with biliary sphincterotomy and balloon extraction, biliary tree was swept and nothing was found at the end of the procedure. General surgery surgery was consulted for cholecystectomy for 2/2.  Postprocedure remained stable.  LFTs with improved bilirubin 5.1, although AST ALT elevated at 238/437 respectively. Patient underwent cholecystectomy 2/2 and informed by surgeon that once able to tolerate diet okay to discharge home after surgery evaluates in evening.

## 2021-03-14 NOTE — Assessment & Plan Note (Addendum)
CPAP bedtime if agrees

## 2021-03-14 NOTE — Progress Notes (Signed)
Transition of Care [facility]) Screening Note  Patient Details  Name: WILLEY DUE Date of Birth: [birth year]  Transition of Care [facility]) CM/SW Contact:    Sherie Don, LCSW Phone Number: 03/14/2021, 10:37 AM  Transition of Care Department [facility]) has reviewed patient and no TOC needs have been identified at this time. We will continue to monitor patient advancement through interdisciplinary progression rounds. If new patient transition needs arise, please place a TOC consult.

## 2021-03-14 NOTE — Anesthesia Procedure Notes (Signed)
Procedure Name: Intubation Date/Time: 03/14/2021 1:40 PM Performed by: Talbot Grumbling, CRNA Pre-anesthesia Checklist: Patient identified, Emergency Drugs available, Suction available and Patient being monitored Patient Re-evaluated:Patient Re-evaluated prior to induction Oxygen Delivery Method: Circle system utilized Preoxygenation: Pre-oxygenation with 100% oxygen Induction Type: IV induction Ventilation: Mask ventilation without difficulty Laryngoscope Size: Mac and 3 Grade View: Grade I Tube type: Oral Tube size: 7.5 mm Number of attempts: 1 Airway Equipment and Method: Stylet Placement Confirmation: positive ETCO2, breath sounds checked- equal and bilateral and ETT inserted through vocal cords under direct vision Secured at: 22 cm Tube secured with: Tape Dental Injury: Teeth and Oropharynx as per pre-operative assessment

## 2021-03-14 NOTE — Anesthesia Preprocedure Evaluation (Addendum)
Anesthesia Evaluation  Patient identified by MRN, date of birth, ID band Patient awake    Reviewed: Allergy & Precautions, H&P , NPO status , Patient's Chart, lab work & pertinent test results, reviewed documented beta blocker date and time   Airway Mallampati: I  TM Distance: >3 FB Neck ROM: full    Dental no notable dental hx. (+) Teeth Intact, Dental Advisory Given   Pulmonary sleep apnea ,    Pulmonary exam normal breath sounds clear to auscultation       Cardiovascular Exercise Tolerance: Good negative cardio ROS   Rhythm:regular Rate:Normal     Neuro/Psych negative neurological ROS  negative psych ROS   GI/Hepatic negative GI ROS, Neg liver ROS,   Endo/Other  negative endocrine ROS  Renal/GU negative Renal ROS  negative genitourinary   Musculoskeletal   Abdominal   Peds  Hematology negative hematology ROS (+)   Anesthesia Other Findings   Reproductive/Obstetrics negative OB ROS                            Anesthesia Physical Anesthesia Plan  ASA: 2 and emergent  Anesthesia Plan: General   Post-op Pain Management: Ofirmev IV (intra-op)   Induction: Intravenous  PONV Risk Score and Plan: 2 and Ondansetron, Dexamethasone and Treatment may vary due to age or medical condition  Airway Management Planned: Oral ETT  Additional Equipment: None  Intra-op Plan:   Post-operative Plan: Extubation in OR  Informed Consent: I have reviewed the patients History and Physical, chart, labs and discussed the procedure including the risks, benefits and alternatives for the proposed anesthesia with the patient or authorized representative who has indicated his/her understanding and acceptance.     Dental Advisory Given  Plan Discussed with: CRNA and Anesthesiologist  Anesthesia Plan Comments: (  HPI: Anthony Schneider is a 66 y.o. male with medical history significant for OSA, GERD, BPH  for elevated total bilirubin.)       Anesthesia Quick Evaluation

## 2021-03-14 NOTE — Assessment & Plan Note (Signed)
Continue PPI ?

## 2021-03-14 NOTE — Progress Notes (Signed)
Pt refused cpap tonight.  Pt was advised that RT is available all night should he change his mind. 

## 2021-03-14 NOTE — Progress Notes (Signed)
PROGRESS NOTE    Anthony Schneider  NFA:[id number] DOB: 15-May-1955 DOA: 03/13/2021 PCP: Anthony Amel, MD   Brief Narrative/Hospital Course: Anthony Schneider, 66 y.o. male With PMH of OSA, GERD, BPH who has been having upper abdominal pain and change in color of the urine back in December initially treated for UTI, past 2 weeks PTA having upper abdominal pain and bloating and mid to lower back pain, chills for several days and fever up to 100.2 and had nausea vomiting on 1/31.  Seen by PCP found to have abnormal LFTs sent to the ED for admission In the ED low-grade fever, AST is elevated to 231, ALT of 435, alkaline phosphatase of 184.  Total bilirubin was 6.8.UA with small bilirubin, negative nitrate or leukocyte, high specific gravity. CT of the abdomen pelvis shows dilated common bile duct with findings suspicious of choledocholithiasis. Other common bile duct lesions not excluded.  Gallbladder sludge. GI was consulted kept n.p.o. for possible ERCP placed on IV antibiotics and admitted for further management .   Subjective: Seen and examined.  Resting comfortably pain significantly better.  Wife at the bedside Tmax 100.9 in the ED yesterday CBC BMP stable  Assessment & Plan: * Cholangitis- (present on admission) Admitted with abdominal pain nausea vomiting abnormal LFTs.  Work-up shows dilated CBD concern about cholangitis choledocholithiasis.  GI consulted for ERCP. discussed with GI.  Continue IV ceftriaxone IV fluids pain control and monitor LFTs  Allergic reaction caused by a drug Patient had a high shortly after Flagyl and discontinued, received Benadryl..Monitor  OSA (obstructive sleep apnea) CPAP bedtime  BPH (benign prostatic hyperplasia) Continue Flomax  GERD (gastroesophageal reflux disease) Continue PPI  DVT prophylaxis: SCDs Start: 03/13/21 2255 Code Status:   Code Status: Full Code Family Communication: plan of care discussed with patient/WIFE at bedside. Status is:  Inpatient Remains inpatient appropriate because: For ongoing management of cholangitis Planned Discharge Destination: Home Disposition: Currently NOT medically stable for discharge.  Objective: Vitals last 24 hrs: Vitals:   03/14/21 0100 03/14/21 0234 03/14/21 0601 03/14/21 0937  BP: 130/78 139/73 136/80 130/73  Pulse: (!) 53 60 (!) 57 (!) 52  Resp: 16 14 16    Temp: 98.6 F (37 C) 98.4 F (36.9 C) 98.1 F (36.7 C) 98.4 F (36.9 C)  TempSrc:   Oral Oral  SpO2: 97% 96% 97% 97%  Weight:      Height:       Weight change:   Physical Examination: General exam: AA0X3, weak,older than stated age. HEENT:Oral mucosa moist, Ear/Nose WNL grossly,dentition normal. Respiratory system: B/l clear BS, no use of accessory muscle, non tender. Cardiovascular system: S1 & S2 +,No JVD. Gastrointestinal system: Abdomen soft, mildly tender RUQ Q,ND, BS+. Nervous System:Alert, awake, moving extremities. Extremities: edema none, distal peripheral pulses palpable.  Skin: No rashes, no icterus. MSK: Normal muscle bulk, tone, power.  Medications reviewed:  Scheduled Meds:  pantoprazole  40 mg Oral Daily   tamsulosin  0.4 mg Oral q AM   Continuous Infusions:  cefTRIAXone (ROCEPHIN)  IV     lactated ringers 125 mL/hr at 03/13/21 2050   Diet Order             Diet NPO time specified  Diet effective midnight           Diet NPO time specified  Diet effective midnight                    Intake/Output Summary (Last 24 hours) at 03/14/2021  [place] filed at 03/14/2021 0600 Gross per 24 hour  Intake 1200 ml  Output 0 ml  Net 1200 ml   Net IO Since Admission: 1,200 mL [03/14/21 1049]  Wt Readings from Last 3 Encounters:  03/13/21 79.4 kg  04/02/13 80.7 kg  04/01/13 77.1 kg     Antimicrobials: Anti-infectives (From admission, onward)    Start     Dose/Rate Route Frequency Ordered Stop   03/14/21 1800  cefTRIAXone (ROCEPHIN) 2 g in sodium chloride 0.9 % 100 mL IVPB        2  g 200 mL/hr over 30 Minutes Intravenous Every 24 hours 03/13/21 2134     03/13/21 2200  metroNIDAZOLE (FLAGYL) IVPB 500 mg  Status:  Discontinued        500 mg 100 mL/hr over 60 Minutes Intravenous Every 12 hours 03/13/21 2104 03/13/21 2256   03/13/21 1900  cefTRIAXone (ROCEPHIN) 2 g in sodium chloride 0.9 % 100 mL IVPB        2 g 200 mL/hr over 30 Minutes Intravenous  Once 03/13/21 1853 03/13/21 2026      Culture/Microbiology    Component Value Date/Time   SDES BLOOD RIGHT ARM 04/02/2013 1645   SPECREQUEST BOTTLES DRAWN AEROBIC AND ANAEROBIC 10CC 04/02/2013 1645   CULT  04/02/2013 1645    NO GROWTH 5 DAYS Performed at [facility] 04/09/2013 FINAL 04/02/2013 1645    Unresulted Labs (From admission, onward)    None       Data Reviewed: I have personally reviewed following labs and imaging studies CBC: Recent Labs  Lab 03/13/21 1643 03/14/21 0426  WBC 10.5 6.0  NEUTROABS 8.8*  --   HGB 15.6 14.3  HCT 44.7 41.6  MCV 88.9 89.5  PLT 254 696   Basic Metabolic Panel: Recent Labs  Lab 03/13/21 1643 03/14/21 0426  NA 137 137  K 3.6 3.7  CL 106 105  CO2 23 26  GLUCOSE 179* 124*  BUN 14 11  CREATININE 0.76 0.70  CALCIUM 9.2 8.8*   GFR: Estimated Creatinine Clearance: 94.2 mL/min (by C-G formula based on SCr of 0.7 mg/dL). Liver Function Tests: Recent Labs  Lab 03/13/21 1643 03/14/21 0426  AST 231* 181*  ALT 435* 349*  ALKPHOS 184* 166*  BILITOT 6.8* 10.4*  PROT 7.2 6.3*  ALBUMIN 4.2 3.4*   Recent Labs  Lab 03/13/21 1643  LIPASE 35   No results for input(s): AMMONIA in the last 168 hours. Coagulation Profile: No results for input(s): INR, PROTIME in the last 168 hours. Cardiac Enzymes: No results for input(s): CKTOTAL, CKMB, CKMBINDEX, TROPONINI in the last 168 hours. BNP (last 3 results) No results for input(s): PROBNP in the last 8760 hours. HbA1C: No results for input(s): HGBA1C in the last 72 hours. CBG: No results for  input(s): GLUCAP in the last 168 hours. Lipid Profile: No results for input(s): CHOL, HDL, LDLCALC, TRIG, CHOLHDL, LDLDIRECT in the last 72 hours. Thyroid Function Tests: No results for input(s): TSH, T4TOTAL, FREET4, T3FREE, THYROIDAB in the last 72 hours. Anemia Panel: No results for input(s): VITAMINB12, FOLATE, FERRITIN, TIBC, IRON, RETICCTPCT in the last 72 hours. Sepsis Labs: No results for input(s): PROCALCITON, LATICACIDVEN in the last 168 hours.  No results found for this or any previous visit (from the past 240 hour(s)).   Radiology Studies: CT ABDOMEN PELVIS W CONTRAST  Result Date: 03/13/2021 CLINICAL DATA:  Acute abdominal pain.  Right upper quadrant pain. EXAM: CT ABDOMEN AND  PELVIS WITH CONTRAST TECHNIQUE: Multidetector CT imaging of the abdomen and pelvis was performed using the standard protocol following bolus administration of intravenous contrast. RADIATION DOSE REDUCTION: This exam was performed according to the departmental dose-optimization program which includes automated exposure control, adjustment of the mA and/or kV according to patient size and/or use of iterative reconstruction technique. CONTRAST:  112mL OMNIPAQUE IOHEXOL 350 MG/ML SOLN COMPARISON:  Ultrasound abdomen 03/13/2021. FINDINGS: Lower chest: No acute abnormality. Hepatobiliary: Gallbladder sludge is present. There are 2 small echogenic foci within the distal common bile duct at the level of the pancreatic head suspicious for choledocholithiasis. Common bile duct measures 1 cm as seen on prior ultrasound. No focal liver lesions are identified. No pericholecystic inflammation. Pancreas: Unremarkable. No pancreatic ductal dilatation or surrounding inflammatory changes. Spleen: Normal in size without focal abnormality. Adrenals/Urinary Tract: Adrenal glands are unremarkable. Kidneys are normal, without renal calculi, focal lesion, or hydronephrosis. Bladder is unremarkable. Stomach/Bowel: Stomach is within normal  limits. Appendix is surgically absent. No evidence of bowel wall thickening, distention, or inflammatory changes. There is colonic diverticulosis without evidence for acute diverticulitis. Vascular/Lymphatic: Aortic atherosclerosis. No enlarged abdominal or pelvic lymph nodes. Reproductive: Prostate gland is enlarged. Other: No abdominal wall hernia or abnormality. No abdominopelvic ascites. Musculoskeletal: No acute or significant osseous findings. IMPRESSION: 1. Dilated common bile duct with findings suspicious for choledocholithiasis. Other common bile duct lesions not excluded. This can be further evaluated with ERCP or MRCP as clinically warranted. 2. Gallbladder sludge. 3. Colonic diverticulosis. 4.  Aortic Atherosclerosis (ICD10-I70.0). 5. Prostatomegaly. Electronically Signed   By: Ronney Asters M.D.   On: 03/13/2021 19:54   US Abdomen Limited RUQ (LIVER/GB)  Result Date: 03/13/2021 CLINICAL DATA:  Right upper quadrant abdominal pain. EXAM: ULTRASOUND ABDOMEN LIMITED RIGHT UPPER QUADRANT COMPARISON:  Abdominal ultrasound 03/09/2021. FINDINGS: Gallbladder: Gallbladder sludge is seen. Gallbladder wall is upper limits of normal in thickness. No gallstones. No pericholecystic fluid. No sonographic Murphy sign noted by sonographer. Common bile duct: Diameter: 1.0 cm Liver: No focal lesion identified. Increase in parenchymal echogenicity. Portal vein is patent on color Doppler imaging with normal direction of blood flow towards the liver. Other: None. IMPRESSION: 1. There is new dilatation of the common bile duct measuring 1 cm concerning for distal biliary obstruction. 2. Gallbladder sludge. 3. Echogenic liver likely related to fatty infiltration. Electronically Signed   By: Ronney Asters M.D.   On: 03/13/2021 17:13     LOS: 1 day   Antonieta Pert, MD [facility]  03/14/2021, 10:49 AM

## 2021-03-14 NOTE — Op Note (Signed)
[facility] Patient Name: Anthony Schneider Procedure Date: 03/14/2021 MRN: [id number] Attending MD: Clarene Essex , MD Date of Birth: 1955/03/01 CSN: [id number] Age: 66 Admit Type: Inpatient Procedure:                ERCP Indications:              Abnormal abdominal CT, For therapy of bile duct                            stone(s), Elevated liver enzymes Providers:                Clarene Essex, MD, Burtis Junes, RN, Truddie Coco, RN Referring MD:              Medicines:                General Anesthesia Complications:            No immediate complications. Estimated Blood Loss:     Estimated blood loss was minimal. Procedure:                Pre-Anesthesia Assessment:                           - Prior to the procedure, a History and Physical                            was performed, and patient medications and                            allergies were reviewed. The patient's tolerance of                            previous anesthesia was also reviewed. The risks                            and benefits of the procedure and the sedation                            options and risks were discussed with the patient.                            All questions were answered, and informed consent                            was obtained. Prior Anticoagulants: The patient has                            taken no previous anticoagulant or antiplatelet                            agents. ASA Grade Assessment: II - A patient with                            mild systemic disease. After reviewing the risks  and benefits, the patient was deemed in                            satisfactory condition to undergo the procedure.                           After obtaining informed consent, the scope was                            passed under direct vision. Throughout the                            procedure, the patient's blood pressure, pulse, and                            oxygen  saturations were monitored continuously. The                            [id number]-[id number] ([id number] Olympus duodenoscope was                            introduced through the mouth, and used to inject                            contrast into and used to cannulate the bile duct.                            The ERCP was accomplished without difficulty. The                            patient tolerated the procedure well. Scope In: Scope Out: Findings:      The major papilla was adjacent to a diverticulum. Deep selective       cannulation was obtained on the first attempt and the major papilla was       normal. Obvious multiple stones were seen on initial cholangiogram and       we proceeded with a biliary sphincterotomy which was made with a       Hydratome sphincterotome using ERBE electrocautery. There was self       limited oozing from the sphincterotomy which did not require treatment.       We proceeded until we had adequate biliary drainage and could get the       fully bowed sphincterotome easily in and out of the duct and       choledocholithiasis was found in a minimally dilated duct. The biliary       tree was swept with an adjustable 12- 15 mm balloon starting at the       bifurcation and left main hepatic duct. Sludge was swept from the duct.       All stones were removed. Multiple balloon pull-through's were done using       both balloons and on occlusion cholangiogram there was a questionable       filling defect in the left main branch and the balloon was advanced past       that and withdrawn and nothing was found. Both size balloons passed  readily through the pain sphincterotomy site and there was no pancreatic       duct injection or wire advancement throughout the procedure and there       was sluggish due to probable duct edema but probably adequate biliary       drainage and we elected to stop the procedure at this point the wire       balloon and scope were all removed and  the patient tolerated the       procedure well Impression:               - The major papilla was adjacent to a diverticulum.                           - The major papilla appeared normal.                           - Choledocholithiasis was found. Complete removal                            was accomplished by biliary sphincterotomy and                            balloon extraction.                           - A biliary sphincterotomy was performed.                           - The biliary tree was swept and nothing was found                            at the end of the procedure. Moderate Sedation:      Not Applicable - Patient had care per Anesthesia. Recommendation:           - Clear liquid diet for 6 hours. If doing well this                            evening may have soft solids                           - Return to GI clinic PRN.                           - Telephone GI clinic if symptomatic PRN.                           - Refer to a surgeon today. To decide surgery                            tomorrow or as an outpatient                           - Check liver enzymes (AST, ALT, alkaline  phosphatase, bilirubin) tomorrow. And follow back                            to normal as an outpatient Procedure Code(s):        --- Professional ---                           [phone], Endoscopic retrograde                            cholangiopancreatography (ERCP); with removal of                            calculi/debris from biliary/pancreatic duct(s)                           43262, Endoscopic retrograde                            cholangiopancreatography (ERCP); with                            sphincterotomy/papillotomy Diagnosis Code(s):        --- Professional ---                           K80.50, Calculus of bile duct without cholangitis                            or cholecystitis without obstruction                           R74.8, Abnormal levels of other serum  enzymes                           R93.5, Abnormal findings on diagnostic imaging of                            other abdominal regions, including retroperitoneum CPT copyright 2019 [employer]. All rights reserved. The codes documented in this report are preliminary and upon coder review may  be revised to meet current compliance requirements. Clarene Essex, MD 03/14/2021 2:24:38 PM This report has been signed electronically. Number of Addenda: 0

## 2021-03-14 NOTE — Transfer of Care (Signed)
Immediate Anesthesia Transfer of Care Note  Patient: Anthony Schneider  Procedure(s) Performed: ENDOSCOPIC RETROGRADE CHOLANGIOPANCREATOGRAPHY (ERCP) SPHINCTEROTOMY REMOVAL OF STONES  Patient Location: PACU  Anesthesia Type:General  Level of Consciousness: awake, alert  and oriented  Airway & Oxygen Therapy: Patient Spontanous Breathing and Patient connected to face mask oxygen  Post-op Assessment: Report given to RN and Post -op Vital signs reviewed and stable  Post vital signs: Reviewed and stable  Last Vitals:  Vitals Value Taken Time  BP    Temp    Pulse    Resp    SpO2      Last Pain:  Vitals:   03/14/21 1306  TempSrc: Oral  PainSc: 0-No pain         Complications: No notable events documented.

## 2021-03-15 ENCOUNTER — Encounter (HOSPITAL_COMMUNITY): Payer: Self-pay | Admitting: Family Medicine

## 2021-03-15 ENCOUNTER — Inpatient Hospital Stay (HOSPITAL_COMMUNITY): Payer: BC Managed Care – PPO | Admitting: Certified Registered Nurse Anesthetist

## 2021-03-15 ENCOUNTER — Encounter (HOSPITAL_COMMUNITY)
Admission: EM | Disposition: A | Discharge status: Home / Self Care | Payer: Self-pay | Source: Home / Self Care | Attending: Internal Medicine

## 2021-03-15 DIAGNOSIS — K8309 Other cholangitis: Secondary | ICD-10-CM | POA: Diagnosis not present

## 2021-03-15 DIAGNOSIS — K8050 Calculus of bile duct without cholangitis or cholecystitis without obstruction: Secondary | ICD-10-CM

## 2021-03-15 HISTORY — PX: CHOLECYSTECTOMY: SHX55

## 2021-03-15 LAB — COMPREHENSIVE METABOLIC PANEL
ALT: 437 U/L — ABNORMAL HIGH (ref 0–44)
AST: 238 U/L — ABNORMAL HIGH (ref 15–41)
Albumin: 3.4 g/dL — ABNORMAL LOW (ref 3.5–5.0)
Alkaline Phosphatase: 178 U/L — ABNORMAL HIGH (ref 38–126)
Anion gap: 7 (ref 5–15)
BUN: 14 mg/dL (ref 8–23)
CO2: 25 mmol/L (ref 22–32)
Calcium: 8.8 mg/dL — ABNORMAL LOW (ref 8.9–10.3)
Chloride: 103 mmol/L (ref 98–111)
Creatinine, Ser: 0.82 mg/dL (ref 0.61–1.24)
GFR, Estimated: >60 mL/min (ref >60)
Glucose, Bld: 145 mg/dL — ABNORMAL HIGH (ref 70–99)
Potassium: 3.9 mmol/L (ref 3.5–5.1)
Sodium: 135 mmol/L (ref 135–145)
Total Bilirubin: 5.1 mg/dL — ABNORMAL HIGH (ref 0.3–1.2)
Total Protein: 6.3 g/dL — ABNORMAL LOW (ref 6.5–8.1)

## 2021-03-15 LAB — CBC
HCT: 43.6 % (ref 39.0–52.0)
Hemoglobin: 15 g/dL (ref 13.0–17.0)
MCH: 30.9 pg (ref 26.0–34.0)
MCHC: 34.4 g/dL (ref 30.0–36.0)
MCV: 89.9 fL (ref 80.0–100.0)
Platelets: 243 10*3/uL (ref 150–400)
RBC: 4.85 MIL/uL (ref 4.22–5.81)
RDW: 13.2 % (ref 11.5–15.5)
WBC: 6.7 10*3/uL (ref 4.0–10.5)
nRBC: 0 % (ref 0.0–0.2)

## 2021-03-15 LAB — SURGICAL PCR SCREEN
MRSA, PCR: NEGATIVE
Staphylococcus aureus: NEGATIVE

## 2021-03-15 LAB — LIPASE, BLOOD: Lipase: 26 U/L (ref 11–51)

## 2021-03-15 SURGERY — LAPAROSCOPIC CHOLECYSTECTOMY WITH INTRAOPERATIVE CHOLANGIOGRAM
Anesthesia: General

## 2021-03-15 MED ORDER — FENTANYL CITRATE (PF) 100 MCG/2ML IJ SOLN
INTRAMUSCULAR | Status: AC
  Filled 2021-03-15: qty 2

## 2021-03-15 MED ORDER — BUPIVACAINE-EPINEPHRINE (PF) 0.25% -1:200000 IJ SOLN
INTRAMUSCULAR | Status: AC
  Filled 2021-03-15: qty 30

## 2021-03-15 MED ORDER — BUPIVACAINE-EPINEPHRINE 0.25% -1:200000 IJ SOLN
INTRAMUSCULAR | Status: DC | PRN
  Administered 2021-03-15: 30 mL

## 2021-03-15 MED ORDER — EPHEDRINE 5 MG/ML INJ
INTRAVENOUS | Status: AC
  Filled 2021-03-15: qty 10

## 2021-03-15 MED ORDER — METHOCARBAMOL 500 MG PO TABS: 500.0000 mg | ORAL_TABLET | Freq: Four times a day (QID) | ORAL | Status: DC | PRN

## 2021-03-15 MED ORDER — PROPOFOL 10 MG/ML IV BOLUS
INTRAVENOUS | Status: AC
  Filled 2021-03-15: qty 20

## 2021-03-15 MED ORDER — KETOROLAC TROMETHAMINE 30 MG/ML IJ SOLN
INTRAMUSCULAR | Status: DC | PRN
Start: 2021-03-15 — End: 2021-03-15
  Administered 2021-03-15: 15 mg via INTRAVENOUS

## 2021-03-15 MED ORDER — ONDANSETRON HCL 4 MG/2ML IJ SOLN
INTRAMUSCULAR | Status: DC | PRN
  Administered 2021-03-15: 4 mg via INTRAVENOUS

## 2021-03-15 MED ORDER — SUGAMMADEX SODIUM 200 MG/2ML IV SOLN
INTRAVENOUS | Status: DC | PRN
Start: 2021-03-15 — End: 2021-03-15
  Administered 2021-03-15: 200 mg via INTRAVENOUS

## 2021-03-15 MED ORDER — ROCURONIUM BROMIDE 10 MG/ML (PF) SYRINGE
PREFILLED_SYRINGE | INTRAVENOUS | Status: DC | PRN
Start: 2021-03-15 — End: 2021-03-15
  Administered 2021-03-15: 50 mg via INTRAVENOUS
  Administered 2021-03-15: 10 mg via INTRAVENOUS

## 2021-03-15 MED ORDER — FENTANYL CITRATE PF 50 MCG/ML IJ SOSY
25.0000 ug | PREFILLED_SYRINGE | INTRAMUSCULAR | Status: DC | PRN
  Administered 2021-03-15 (×3): 50 ug via INTRAVENOUS

## 2021-03-15 MED ORDER — GLYCOPYRROLATE 0.2 MG/ML IJ SOLN
INTRAMUSCULAR | Status: DC | PRN
  Administered 2021-03-15 (×2): .2 mg via INTRAVENOUS

## 2021-03-15 MED ORDER — MORPHINE SULFATE (PF) 2 MG/ML IV SOLN: 2.0000 mg | INTRAVENOUS | Status: DC | PRN

## 2021-03-15 MED ORDER — FENTANYL CITRATE PF 50 MCG/ML IJ SOSY
PREFILLED_SYRINGE | INTRAMUSCULAR | Status: AC
  Filled 2021-03-15: qty 1

## 2021-03-15 MED ORDER — MIDAZOLAM HCL 5 MG/5ML IJ SOLN
INTRAMUSCULAR | Status: DC | PRN
  Administered 2021-03-15: 2 mg via INTRAVENOUS

## 2021-03-15 MED ORDER — EPHEDRINE SULFATE-NACL 50-0.9 MG/10ML-% IV SOSY
PREFILLED_SYRINGE | INTRAVENOUS | Status: DC | PRN
  Administered 2021-03-15: 5 mg via INTRAVENOUS

## 2021-03-15 MED ORDER — OXYCODONE HCL 5 MG PO TABS
5.0000 mg | ORAL_TABLET | ORAL | Status: DC | PRN
  Administered 2021-03-15: 10 mg via ORAL
  Filled 2021-03-15: qty 2

## 2021-03-15 MED ORDER — LIDOCAINE 2% (20 MG/ML) 5 ML SYRINGE
INTRAMUSCULAR | Status: DC | PRN
  Administered 2021-03-15: 60 mg via INTRAVENOUS

## 2021-03-15 MED ORDER — DEXAMETHASONE SODIUM PHOSPHATE 10 MG/ML IJ SOLN
INTRAMUSCULAR | Status: AC
  Filled 2021-03-15: qty 1

## 2021-03-15 MED ORDER — LACTATED RINGERS IV SOLN
INTRAVENOUS | Status: AC | PRN
  Administered 2021-03-15: 1000 mL via INTRAVENOUS

## 2021-03-15 MED ORDER — GABAPENTIN 300 MG PO CAPS: 300.0000 mg | ORAL_CAPSULE | Freq: Once | ORAL | Status: DC

## 2021-03-15 MED ORDER — FENTANYL CITRATE (PF) 100 MCG/2ML IJ SOLN
INTRAMUSCULAR | Status: DC | PRN
  Administered 2021-03-15 (×4): 50 ug via INTRAVENOUS

## 2021-03-15 MED ORDER — KETOROLAC TROMETHAMINE 30 MG/ML IJ SOLN
INTRAMUSCULAR | Status: AC
  Filled 2021-03-15: qty 1

## 2021-03-15 MED ORDER — ROCURONIUM BROMIDE 10 MG/ML (PF) SYRINGE
PREFILLED_SYRINGE | INTRAVENOUS | Status: AC
  Filled 2021-03-15: qty 10

## 2021-03-15 MED ORDER — ACETAMINOPHEN 500 MG PO TABS: 1000.0000 mg | ORAL_TABLET | Freq: Four times a day (QID) | ORAL | Status: DC | PRN

## 2021-03-15 MED ORDER — AMISULPRIDE (ANTIEMETIC) 5 MG/2ML IV SOLN: 10.0000 mg | Freq: Once | INTRAVENOUS | Status: DC | PRN

## 2021-03-15 MED ORDER — LACTATED RINGERS IV SOLN: INTRAVENOUS | Status: DC

## 2021-03-15 MED ORDER — FENTANYL CITRATE PF 50 MCG/ML IJ SOSY
PREFILLED_SYRINGE | INTRAMUSCULAR | Status: AC
  Filled 2021-03-15: qty 2

## 2021-03-15 MED ORDER — PROPOFOL 10 MG/ML IV BOLUS
INTRAVENOUS | Status: DC | PRN
  Administered 2021-03-15: 150 mg via INTRAVENOUS

## 2021-03-15 MED ORDER — LIDOCAINE HCL (PF) 2 % IJ SOLN
INTRAMUSCULAR | Status: AC
  Filled 2021-03-15: qty 5

## 2021-03-15 MED ORDER — TRAMADOL HCL 50 MG PO TABS: 50.0000 mg | ORAL_TABLET | Freq: Four times a day (QID) | ORAL | 0 refills | Status: DC | PRN

## 2021-03-15 MED ORDER — MIDAZOLAM HCL 2 MG/2ML IJ SOLN
INTRAMUSCULAR | Status: AC
  Filled 2021-03-15: qty 2

## 2021-03-15 MED ORDER — DEXAMETHASONE SODIUM PHOSPHATE 10 MG/ML IJ SOLN
INTRAMUSCULAR | Status: DC | PRN
  Administered 2021-03-15: 5 mg via INTRAVENOUS

## 2021-03-15 MED ORDER — ONDANSETRON HCL 4 MG/2ML IJ SOLN
INTRAMUSCULAR | Status: AC
  Filled 2021-03-15: qty 2

## 2021-03-15 SURGICAL SUPPLY — 47 items
ADH SKN CLS APL DERMABOND .7 (GAUZE/BANDAGES/DRESSINGS) ×1
APL PRP STRL LF DISP 70% ISPRP (MISCELLANEOUS) ×1
APPLIER CLIP ROT 10 11.4 M/L (STAPLE) ×2
APR CLP MED LRG 11.4X10 (STAPLE) ×1
BAG COUNTER SPONGE SURGICOUNT (BAG) IMPLANT
BAG SPEC RTRVL 10 TROC 200 (ENDOMECHANICALS) ×1
BAG SPNG CNTER NS LX DISP (BAG)
CABLE HIGH FREQUENCY MONO STRZ (ELECTRODE) ×3 IMPLANT
CATH URETL OPEN 5X70 (CATHETERS) IMPLANT
CHLORAPREP W/TINT 26 (MISCELLANEOUS) ×3 IMPLANT
CLIP APPLIE ROT 10 11.4 M/L (STAPLE) ×2 IMPLANT
COVER MAYO STAND STRL (DRAPES) IMPLANT
COVER SURGICAL LIGHT HANDLE (MISCELLANEOUS) ×3 IMPLANT
DERMABOND ADVANCED (GAUZE/BANDAGES/DRESSINGS) ×1
DERMABOND ADVANCED .7 DNX12 (GAUZE/BANDAGES/DRESSINGS) ×2 IMPLANT
DRAPE C-ARM 42X120 X-RAY (DRAPES) IMPLANT
ELECT REM PT RETURN 15FT ADLT (MISCELLANEOUS) ×3 IMPLANT
ENDOLOOP SUT PDS II  0 18 (SUTURE) ×2
ENDOLOOP SUT PDS II 0 18 (SUTURE) ×2 IMPLANT
GLOVE SRG 8 PF TXTR STRL LF DI (GLOVE) ×2 IMPLANT
GLOVE SURG ENC MOIS LTX SZ7.5 (GLOVE) ×3 IMPLANT
GLOVE SURG UNDER POLY LF SZ8 (GLOVE) ×2
GOWN STRL REUS W/TWL LRG LVL3 (GOWN DISPOSABLE) ×3 IMPLANT
GOWN STRL REUS W/TWL XL LVL3 (GOWN DISPOSABLE) ×6 IMPLANT
GRASPER SUT TROCAR 14GX15 (MISCELLANEOUS) IMPLANT
HEMOSTAT SNOW SURGICEL 2X4 (HEMOSTASIS) ×1 IMPLANT
IRRIG SUCT STRYKERFLOW 2 WTIP (MISCELLANEOUS) ×2
IRRIGATION SUCT STRKRFLW 2 WTP (MISCELLANEOUS) ×2 IMPLANT
IV CATH 14GX2 1/4 (CATHETERS) ×3 IMPLANT
KIT BASIN OR (CUSTOM PROCEDURE TRAY) ×3 IMPLANT
KIT TURNOVER KIT A (KITS) IMPLANT
NDL INSUFFLATION 14GA 120MM (NEEDLE) ×2 IMPLANT
NEEDLE INSUFFLATION 14GA 120MM (NEEDLE) ×2 IMPLANT
PENCIL SMOKE EVACUATOR (MISCELLANEOUS) IMPLANT
POUCH RETRIEVAL ECOSAC 10 (ENDOMECHANICALS) ×2 IMPLANT
POUCH RETRIEVAL ECOSAC 10MM (ENDOMECHANICALS) ×2
SCISSORS LAP 5X35 DISP (ENDOMECHANICALS) ×3 IMPLANT
SET TUBE SMOKE EVAC HIGH FLOW (TUBING) ×3 IMPLANT
SLEEVE XCEL OPT CAN 5 100 (ENDOMECHANICALS) ×6 IMPLANT
SPIKE FLUID TRANSFER (MISCELLANEOUS) ×3 IMPLANT
STOPCOCK 4 WAY LG BORE MALE ST (IV SETS) IMPLANT
SUT MNCRL AB 4-0 PS2 18 (SUTURE) ×3 IMPLANT
TOWEL OR 17X26 10 PK STRL BLUE (TOWEL DISPOSABLE) ×3 IMPLANT
TOWEL OR NON WOVEN STRL DISP B (DISPOSABLE) IMPLANT
TRAY LAPAROSCOPIC (CUSTOM PROCEDURE TRAY) ×3 IMPLANT
TROCAR BLADELESS OPT 5 100 (ENDOMECHANICALS) ×3 IMPLANT
TROCAR XCEL 12X100 BLDLESS (ENDOMECHANICALS) ×3 IMPLANT

## 2021-03-15 NOTE — Discharge Summary (Signed)
Physician Discharge Summary   Patient: Anthony Schneider MRN: [id number] DOB: [birth year]  Admit date:     03/13/2021  Discharge date: 03/15/21  Discharge Physician: Antonieta Pert   PCP: Lujean Amel, MD   Recommendations at discharge:   Check CMP in 5 to 7 days. Follow-up with general surgery for postop follow-up.  Discharge Diagnoses: Principal Problem:   Cholangitis Active Problems:   Choledocholithiasis   GERD (gastroesophageal reflux disease)   BPH (benign prostatic hyperplasia)   OSA (obstructive sleep apnea)   Allergic reaction caused by a drug  Resolved Problems:   [facility] Course:  66yo M With PMH of OSA, GERD, BPH who has been having upper abdominal pain and change in color of the urine back in December initially treated for UTI, past 2 weeks PTA having upper abdominal pain and bloating and mid to lower back pain, chills for several days and fever up to 100.2 and had nausea vomiting on 1/31.  Seen by PCP found to have abnormal LFTs sent to the ED for admission In the ED low-grade fever, AST is elevated to 231, ALT of 435, alkaline phosphatase of 184.  Total bilirubin was 6.8.UA with small bilirubin, negative nitrate or leukocyte, high specific gravity. CT of the abdomen pelvis shows dilated common bile duct with findings suspicious of choledocholithiasis. Other common bile duct lesions not excluded.  Gallbladder sludge. GI was consulted kept n.p.o. for possible ERCP placed on IV antibiotics and admitted for further management. Seen by [facility]ie [facility]er s/p ERCP- choledocholithiaiss found and removed with biliary sphincterotomy and balloon extraction, biliary tree was swept and nothing was found at the end of the procedure. General surgery surgery was consulted for cholecystectomy for 2/2.  Postprocedure remained stable.  LFTs with improved bilirubin 5.1, although AST ALT elevated at 238/437 respectively. Patient underwent cholecystectomy 2/2 and informed by surgeon that once able to  tolerate diet okay to discharge home after surgery evaluates in evening.  He will be discharged  as he has voided and cleared by surgery thsi evening after tolerating diet once cleared  by surgery. Per surgery no need for antibiotics.  Assessment and Plan: * Cholangitis- (present on admission) With choledocholithiasis, status post ERCP with stent removal and sphincterotomy 2/1.  Subsequently cholecystectomy 2/2.  Surgery informed that once able to tolerate diet patient will be discharged home and have outpatient follow-up.  Follow-up with PCP to check LFTs in a week.    Choledocholithiasis See above  Allergic reaction caused by a drug Patient had a high shortly after Flagyl and discontinued, received Benadryl..Monitor  OSA (obstructive sleep apnea) CPAP bedtime if agrees  BPH (benign prostatic hyperplasia) Monitor voiding, continue Flomax  GERD (gastroesophageal reflux disease) Continue PPI     Pain control - [place] Controlled Substance Reporting System database was reviewed. and patient was instructed, not to drive, operate heavy machinery, perform activities at heights, swimming or participation in water activities or provide baby-sitting services while on Pain, Sleep and Anxiety Medications; until their outpatient Physician has advised to do so again. Also recommended to not to take more than prescribed Pain, Sleep and Anxiety Medications.   Consultants: GI, general surgery Procedures performed: ERCP, cholecystectomy Disposition: Home Diet recommendation:  Discharge Diet Orders (From admission, onward)     Start     Ordered   03/15/21 0000  Diet general        03/15/21 1252           Soft diet  DISCHARGE MEDICATION: Allergies  as of 03/15/2021       Reactions   Flagyl [metronidazole] Hives   Penicillin G Anaphylaxis        Medication List     STOP taking these medications    doxycycline 100 MG capsule Commonly known as: VIBRAMYCIN    oxyCODONE-acetaminophen 5-325 MG tablet Commonly known as: Percocet       TAKE these medications    [employer] Probiotic (Lactobacillus) Caps Take 1 tablet by mouth daily.   EPINEPHrine 0.3 mg/0.3 mL Soaj injection Commonly known as: EPI-PEN Inject 0.3 mLs (0.3 mg total) into the muscle once. What changed:  when to take this reasons to take this   ibuprofen 200 MG tablet Commonly known as: ADVIL Take 200-400 mg by mouth every 6 (six) hours as needed for mild pain or headache.   loratadine 10 MG tablet Commonly known as: CLARITIN Take 10 mg by mouth daily as needed for allergies.   naproxen sodium 220 MG tablet Commonly known as: ALEVE Take 220-440 mg by mouth 2 (two) times daily as needed (for pain or headaches).   omeprazole 40 MG capsule Commonly known as: PRILOSEC Take 40 mg by mouth daily before breakfast.   ondansetron 4 MG disintegrating tablet Commonly known as: Zofran ODT Take 1 tablet (4 mg total) by mouth every 8 (eight) hours as needed for nausea or vomiting.   tadalafil 5 MG tablet Commonly known as: CIALIS Take 5 mg by mouth daily.   tamsulosin 0.4 MG Caps capsule Commonly known as: FLOMAX Take 0.4 mg by mouth in the morning.        Follow-up Information     Surgery, [place]. Call in 1 day(s).   Specialty: General Surgery Why: Please call to confirm your appointment date and time. Please bring a copy of your photo ID and insurance card. Please arrive 30 minutes prior to your appoitnment for paperwork. Contact information: [address] [place] [address] [phone]         Lujean Amel, MD Follow up.   Specialty: Family Medicine Why: Please check LFTs in 5 to 7 days and follow-up with PCP Contact information: [address] [place] [address] [phone]                 Discharge Exam: Danley Danker Weights   03/13/21 1625 03/14/21 1306  Weight: 79.4 kg 79.4 kg   Condition at  discharge: good  The results of significant diagnostics from this hospitalization (including imaging, microbiology, ancillary and laboratory) are listed below for reference.   Imaging Studies: CT ABDOMEN PELVIS W CONTRAST  Result Date: 03/13/2021 CLINICAL DATA:  Acute abdominal pain.  Right upper quadrant pain. EXAM: CT ABDOMEN AND PELVIS WITH CONTRAST TECHNIQUE: Multidetector CT imaging of the abdomen and pelvis was performed using the standard protocol following bolus administration of intravenous contrast. RADIATION DOSE REDUCTION: This exam was performed according to the departmental dose-optimization program which includes automated exposure control, adjustment of the mA and/or kV according to patient size and/or use of iterative reconstruction technique. CONTRAST:  175mL OMNIPAQUE IOHEXOL 350 MG/ML SOLN COMPARISON:  Ultrasound abdomen 03/13/2021. FINDINGS: Lower chest: No acute abnormality. Hepatobiliary: Gallbladder sludge is present. There are 2 small echogenic foci within the distal common bile duct at the level of the pancreatic head suspicious for choledocholithiasis. Common bile duct measures 1 cm as seen on prior ultrasound. No focal liver lesions are identified. No pericholecystic inflammation. Pancreas: Unremarkable. No pancreatic ductal dilatation or surrounding inflammatory  changes. Spleen: Normal in size without focal abnormality. Adrenals/Urinary Tract: Adrenal glands are unremarkable. Kidneys are normal, without renal calculi, focal lesion, or hydronephrosis. Bladder is unremarkable. Stomach/Bowel: Stomach is within normal limits. Appendix is surgically absent. No evidence of bowel wall thickening, distention, or inflammatory changes. There is colonic diverticulosis without evidence for acute diverticulitis. Vascular/Lymphatic: Aortic atherosclerosis. No enlarged abdominal or pelvic lymph nodes. Reproductive: Prostate gland is enlarged. Other: No abdominal wall hernia or abnormality. No  abdominopelvic ascites. Musculoskeletal: No acute or significant osseous findings. IMPRESSION: 1. Dilated common bile duct with findings suspicious for choledocholithiasis. Other common bile duct lesions not excluded. This can be further evaluated with ERCP or MRCP as clinically warranted. 2. Gallbladder sludge. 3. Colonic diverticulosis. 4.  Aortic Atherosclerosis (ICD10-I70.0). 5. Prostatomegaly. Electronically Signed   By: Ronney Asters M.D.   On: 03/13/2021 19:54   DG ERCP WITH SPHINCTEROTOMY  Result Date: 03/14/2021 CLINICAL DATA:  ERCP with stone removal.  Jaundice. EXAM: ERCP TECHNIQUE: Multiple spot images obtained with the fluoroscopic device and submitted for interpretation post-procedure. FLUOROSCOPY TIME:  3 minutes, 48 seconds COMPARISON:  CT abdomen pelvis-03/13/2021 FINDINGS: Seven spot intraoperative fluoroscopic images of the right upper abdominal quadrant during ERCP are provided for review Initial image demonstrates an ERCP probe overlying the right upper abdominal quadrant. There is selective cannulation and opacification of the common bile duct which appears moderately dilated. There are several persistent nonocclusive filling defects within the CBD (representative images 1 through 3) suggestive of choledocholithiasis as was questioned on preceding abdominal CT Subsequent images demonstrate insufflation of a balloon within the central aspect of the CBD with subsequent biliary sweeping and presumed stone extraction and sphincterotomy. IMPRESSION: ERCP with findings suggestive of choledocholithiasis with subsequent biliary sweeping, stone extraction and presumed sphincterotomy. These images were submitted for radiologic interpretation only. Please see the procedural report for the amount of contrast and the fluoroscopy time utilized. Electronically Signed   By: Sandi Mariscal M.D.   On: 03/14/2021 14:26   US Abdomen Limited RUQ (LIVER/GB)  Result Date: 03/13/2021 CLINICAL DATA:  Right upper  quadrant abdominal pain. EXAM: ULTRASOUND ABDOMEN LIMITED RIGHT UPPER QUADRANT COMPARISON:  Abdominal ultrasound 03/09/2021. FINDINGS: Gallbladder: Gallbladder sludge is seen. Gallbladder wall is upper limits of normal in thickness. No gallstones. No pericholecystic fluid. No sonographic Murphy sign noted by sonographer. Common bile duct: Diameter: 1.0 cm Liver: No focal lesion identified. Increase in parenchymal echogenicity. Portal vein is patent on color Doppler imaging with normal direction of blood flow towards the liver. Other: None. IMPRESSION: 1. There is new dilatation of the common bile duct measuring 1 cm concerning for distal biliary obstruction. 2. Gallbladder sludge. 3. Echogenic liver likely related to fatty infiltration. Electronically Signed   By: Ronney Asters M.D.   On: 03/13/2021 17:13    Microbiology: Results for orders placed or performed during the hospital encounter of 03/13/21  Resp Panel by RT-PCR (Flu A&B, Covid) Nasopharyngeal Swab     Status: None   Collection Time: 03/14/21  6:05 PM   Specimen: Nasopharyngeal Swab; Nasopharyngeal(NP) swabs in vial transport medium  Result Value Ref Range Status   SARS Coronavirus 2 by RT PCR NEGATIVE NEGATIVE Final    Comment: (NOTE) SARS-CoV-2 target nucleic acids are NOT DETECTED.  The SARS-CoV-2 RNA is generally detectable in upper respiratory specimens during the acute phase of infection. The lowest concentration of SARS-CoV-2 viral copies this assay can detect is 138 copies/mL. A negative result does not preclude SARS-Cov-2 infection and should not be  used as the sole basis for treatment or other patient management decisions. A negative result may occur with  improper specimen collection/handling, submission of specimen other than nasopharyngeal swab, presence of viral mutation(s) within the areas targeted by this assay, and inadequate number of viral copies(<138 copies/mL). A negative result must be combined with clinical  observations, patient history, and epidemiological information. The expected result is Negative.  Fact Sheet for Patients:  EntrepreneurPulse.com.au  Fact Sheet for Healthcare Providers:  IncredibleEmployment.be  This test is no t yet approved or cleared by the [place] FDA and  has been authorized for detection and/or diagnosis of SARS-CoV-2 by FDA under an Emergency Use Authorization (EUA). This EUA will remain  in effect (meaning this test can be used) for the duration of the COVID-19 declaration under Section 564(b)(1) of the Act, 21 U.S.C.section 360bbb-3(b)(1), unless the authorization is terminated  or revoked sooner.       Influenza A by PCR NEGATIVE NEGATIVE Final   Influenza B by PCR NEGATIVE NEGATIVE Final    Comment: (NOTE) The Xpert Xpress SARS-CoV-2/FLU/RSV plus assay is intended as an aid in the diagnosis of influenza from Nasopharyngeal swab specimens and should not be used as a sole basis for treatment. Nasal washings and aspirates are unacceptable for Xpert Xpress SARS-CoV-2/FLU/RSV testing.  Fact Sheet for Patients: EntrepreneurPulse.com.au  Fact Sheet for Healthcare Providers: IncredibleEmployment.be  This test is not yet approved or cleared by the [place] FDA and has been authorized for detection and/or diagnosis of SARS-CoV-2 by FDA under an Emergency Use Authorization (EUA). This EUA will remain in effect (meaning this test can be used) for the duration of the COVID-19 declaration under Section 564(b)(1) of the Act, 21 U.S.C. section 360bbb-3(b)(1), unless the authorization is terminated or revoked.  Performed at [facility], [place] [address] [place], [place] [address]   Surgical pcr screen     Status: None   Collection Time: 03/15/21  8:06 AM   Specimen: Nasal Mucosa; Nasal Swab  Result Value Ref Range Status   MRSA, PCR NEGATIVE NEGATIVE Final    Staphylococcus aureus NEGATIVE NEGATIVE Final    Comment: (NOTE) The Xpert SA Assay (FDA approved for NASAL specimens in patients 73 years of age and older), is one component of a comprehensive surveillance program. It is not intended to diagnose infection nor to guide or monitor treatment. Performed at [facility], [place] [address] [place], [place] [address]     Labs: CBC: Recent Labs  Lab 03/13/21 1643 03/14/21 0426 03/15/21 0431  WBC 10.5 6.0 6.7  NEUTROABS 8.8*  --   --   HGB 15.6 14.3 15.0  HCT 44.7 41.6 43.6  MCV 88.9 89.5 89.9  PLT 254 213 536   Basic Metabolic Panel: Recent Labs  Lab 03/13/21 1643 03/14/21 0426 03/15/21 0431  NA 137 137 135  K 3.6 3.7 3.9  CL 106 105 103  CO2 23 26 25   GLUCOSE 179* 124* 145*  BUN 14 11 14   CREATININE 0.76 0.70 0.82  CALCIUM 9.2 8.8* 8.8*   Liver Function Tests: Recent Labs  Lab 03/13/21 1643 03/14/21 0426 03/15/21 0431  AST 231* 181* 238*  ALT 435* 349* 437*  ALKPHOS 184* 166* 178*  BILITOT 6.8* 10.4* 5.1*  PROT 7.2 6.3* 6.3*  ALBUMIN 4.2 3.4* 3.4*   CBG: No results for input(s): GLUCAP in the last 168 hours.  Discharge time spent:  35 minutes.  Signed: Antonieta Pert, MD [facility] 03/15/2021

## 2021-03-15 NOTE — Progress Notes (Signed)
Patient in surgery when we came by and case discussed with surgical team and no obvious ERCP problems and bilirubin decreased and will check on tomorrow

## 2021-03-15 NOTE — Progress Notes (Signed)
Anthony Schneider had an ERCP yesterday for choledocholithiasis.  No signs of post ERCP pancreatitis.  We will proceed with laparoscopic cholecystectomy today.  The procedure itself as well as its risks, benefits and alternatives were discussed and the patient granted consent to proceed.  We will proceed as scheduled.  Felicie Morn, MD General, Bariatric and Minimally Invasive Surgery [facility], [place]

## 2021-03-15 NOTE — Assessment & Plan Note (Addendum)
See above

## 2021-03-15 NOTE — Progress Notes (Signed)
Reviewed written d/c instrucitons w pt and all questions answered. He verbalized understanding. D/ C via w/c w all belongings in stable condition.

## 2021-03-15 NOTE — Anesthesia Procedure Notes (Signed)
Procedure Name: Intubation Date/Time: 03/15/2021 9:38 AM Performed by: West Pugh, CRNA Pre-anesthesia Checklist: Patient identified, Emergency Drugs available, Suction available, Patient being monitored and Timeout performed Patient Re-evaluated:Patient Re-evaluated prior to induction Oxygen Delivery Method: Circle system utilized Preoxygenation: Pre-oxygenation with 100% oxygen Induction Type: IV induction Ventilation: Mask ventilation without difficulty Laryngoscope Size: Mac and 4 Grade View: Grade I Tube type: Oral Number of attempts: 1 Airway Equipment and Method: Stylet Placement Confirmation: ETT inserted through vocal cords under direct vision, positive ETCO2, CO2 detector and breath sounds checked- equal and bilateral Secured at: 22 cm Tube secured with: Tape Dental Injury: Teeth and Oropharynx as per pre-operative assessment

## 2021-03-15 NOTE — Transfer of Care (Signed)
Immediate Anesthesia Transfer of Care Note  Patient: Anthony Schneider  Procedure(s) Performed: LAPAROSCOPIC CHOLECYSTECTOMY  Patient Location: PACU  Anesthesia Type:General  Level of Consciousness: drowsy and patient cooperative  Airway & Oxygen Therapy: Patient Spontanous Breathing and Patient connected to face mask oxygen  Post-op Assessment: Report given to RN and Post -op Vital signs reviewed and stable  Post vital signs: Reviewed and stable  Last Vitals:  Vitals Value Taken Time  BP 168/91 03/15/21 1055  Temp    Pulse 83 03/15/21 1057  Resp 12 03/15/21 1057  SpO2 100 % 03/15/21 1057  Vitals shown include unvalidated device data.  Last Pain:  Vitals:   03/15/21 0830  TempSrc: Oral  PainSc:       Patients Stated Pain Goal: 3 (29/56/21 3086)  Complications: No notable events documented.

## 2021-03-15 NOTE — Discharge Instructions (Signed)
[facility]  Please arrive at least 30 min before your appointment to complete your check in paperwork.  If you are unable to arrive 30 min prior to your appointment time we may have to cancel or reschedule you. LAPAROSCOPIC SURGERY: POST OP INSTRUCTIONS Always review your discharge instruction sheet given to you by the facility where your surgery was performed. IF YOU HAVE DISABILITY OR FAMILY LEAVE FORMS, YOU MUST BRING THEM TO THE OFFICE FOR PROCESSING.   DO NOT GIVE THEM TO YOUR DOCTOR.  PAIN CONTROL  First take acetaminophen (Tylenol) AND/or ibuprofen (Advil) to control your pain after surgery.  Follow directions on package.  Taking acetaminophen (Tylenol) and/or ibuprofen (Advil) regularly after surgery will help to control your pain and lower the amount of prescription pain medication you may need.  You should not take more than 4,000 mg (4 grams) of acetaminophen (Tylenol) in 24 hours.  You should not take ibuprofen (Advil), aleve, motrin, naprosyn or other NSAIDS if you have a history of stomach ulcers or chronic kidney disease.  A prescription for pain medication may be given to you upon discharge.  Take your pain medication as prescribed, if you still have uncontrolled pain after taking acetaminophen (Tylenol) or ibuprofen (Advil). Use ice packs to help control pain. If you need a refill on your pain medication, please contact your pharmacy.  They will contact our office to request authorization. Prescriptions will not be filled after 5pm or on week-ends.  HOME MEDICATIONS Take your usually prescribed medications unless otherwise directed.  DIET You should follow a light diet the first few days after arrival home.  Be sure to include lots of fluids daily. Avoid fatty, fried foods.   CONSTIPATION It is common to experience some constipation after surgery and if you are taking pain medication.  Increasing fluid intake and taking a stool softener (such as Colace)  will usually help or prevent this problem from occurring.  A mild laxative (Milk of Magnesia or Miralax) should be taken according to package instructions if there are no bowel movements after 48 hours.  WOUND/INCISION CARE Most patients will experience some swelling and bruising in the area of the incisions.  Ice packs will help.  Swelling and bruising can take several days to resolve.  Unless discharge instructions indicate otherwise, follow guidelines below  STERI-STRIPS - you may remove your outer bandages 48 hours after surgery, and you may shower at that time.  You have steri-strips (small skin tapes) in place directly over the incision.  These strips should be left on the skin for 7-10 days.   DERMABOND/SKIN GLUE - you may shower in 24 hours.  The glue will flake off over the next 2-3 weeks. Any sutures or staples will be removed at the office during your follow-up visit.  ACTIVITIES You may resume regular (light) daily activities beginning the next day--such as daily self-care, walking, climbing stairs--gradually increasing activities as tolerated.  You may have sexual intercourse when it is comfortable.  Refrain from any heavy lifting or straining until approved by your doctor. You may drive when you are no longer taking prescription pain medication, you can comfortably wear a seatbelt, and you can safely maneuver your car and apply brakes.  FOLLOW-UP You should see your doctor in the office for a follow-up appointment approximately 2-3 weeks after your surgery.  You should have been given your post-op/follow-up appointment when your surgery was scheduled.  If you did not receive a post-op/follow-up appointment, make sure   that you call for this appointment within a day or two after you arrive home to insure a convenient appointment time.   WHEN TO CALL YOUR DOCTOR: Fever over 101.0 Inability to urinate Continued bleeding from incision. Increased pain, redness, or drainage from the  incision. Increasing abdominal pain  The clinic staff is available to answer your questions during regular business hours.  Please don't hesitate to call and ask to speak to one of the nurses for clinical concerns.  If you have a medical emergency, go to the nearest emergency room or call 911.  A surgeon from [facility] is always on call at the hospital. [address], [address], [place], [place]  [address] ? [address] [address], [place], [place]   [address] [phone] ? [phone] ? [phone] [phone]  

## 2021-03-15 NOTE — Anesthesia Preprocedure Evaluation (Signed)
Anesthesia Evaluation  Patient identified by MRN, date of birth, ID band Patient awake    Reviewed: Allergy & Precautions, NPO status , Patient's Chart, lab work & pertinent test results  Airway Mallampati: II  TM Distance: >3 FB Neck ROM: Full    Dental  (+) Dental Advisory Given   Pulmonary sleep apnea ,    breath sounds clear to auscultation       Cardiovascular negative cardio ROS   Rhythm:Regular Rate:Normal     Neuro/Psych negative neurological ROS     GI/Hepatic Neg liver ROS, GERD  ,  Endo/Other  negative endocrine ROS  Renal/GU negative Renal ROS     Musculoskeletal   Abdominal   Peds  Hematology negative hematology ROS (+)   Anesthesia Other Findings   Reproductive/Obstetrics                             Anesthesia Physical Anesthesia Plan  ASA: 2  Anesthesia Plan: General   Post-op Pain Management: Toradol IV (intra-op) and Gabapentin PO (pre-op)   Induction:   PONV Risk Score and Plan: 2 and Dexamethasone, Ondansetron and Treatment may vary due to age or medical condition  Airway Management Planned: Oral ETT  Additional Equipment: None  Intra-op Plan:   Post-operative Plan: Extubation in OR  Informed Consent: I have reviewed the patients History and Physical, chart, labs and discussed the procedure including the risks, benefits and alternatives for the proposed anesthesia with the patient or authorized representative who has indicated his/her understanding and acceptance.     Dental advisory given  Plan Discussed with: CRNA  Anesthesia Plan Comments:         Anesthesia Quick Evaluation

## 2021-03-15 NOTE — Op Note (Signed)
Patient: Anthony Schneider (02/20/1955, [id number])  Date of Surgery: 03/13/2021 - 03/14/2021   Preoperative Diagnosis: CHOLANGITIS, CHOLEDOCHOLITHIASIS   Postoperative Diagnosis: CHOLANGITIS ,CHOLEDOCHOLITHIASIS   Surgical Procedure: LAPAROSCOPIC CHOLECYSTECTOMY: 95638 (CPT)   Operative Team Members:  Surgeon(s) and Role:    * Marissa Lowrey, Nickola Major, MD - Primary   Anesthesiologist: Suzette Battiest, MD CRNA: Montel Clock, CRNA; West Pugh, CRNA   Anesthesia: General   Fluids:  Total I/O In: 1000 [I.V.:1000] Out: 20 [VFIEP:32]  Complications: None  Drains:  none   Specimen:  ID Type Source Tests Collected by Time Destination  1 : Gallbladder Tissue PATH Gallbladder SURGICAL PATHOLOGY Quamere Mussell, Nickola Major, MD 03/15/2021 1019      Disposition:  PACU - hemodynamically stable.  Plan of Care:  Okay for discharge home from surgery perspective.    Indications for Procedure: Anthony Schneider is a 66 y.o. male who presented with choledocholithiasis.  He underwent ERCP.  He had no signs of post ERCP pancreatitis.  Laparoscopic cholecystectomy was recommended for the patient.  The procedure itself, as well as the risks, benefits and alternatives were discussed with the patient.  Risks discussed included but were not limited to the risk of infection, bleeding, damage to nearby structures, need to convert to open procedure, incisional hernia, bile leak, common bile duct injury and the need for additional procedures or surgeries.  With this discussion complete and all questions answered the patient granted consent to proceed.  Findings: Inflamed gallbladder  Infection status: Patient: Anthony Schneider Emergency General Surgery Service Patient Case: Emergent Infection Present At Time Of Surgery (PATOS): None   Description of Procedure:   On the date stated above, the patient was taken to the operating room suite and placed in supine positioning.  Sequential compression devices  were placed on the lower extremities to prevent blood clots.  General endotracheal anesthesia was induced. Preoperative antibiotics were given prior to surgery.  The patient's abdomen was prepped and draped in the usual sterile fashion.  A time-out was completed verifying the correct patient, procedure, positioning and equipment needed for the case.  We began by anesthetizing the skin with local anesthetic and then making a 5 mm incision just below the umbilicus.  We dissected through the subcutaneous tissues to the fascia.  The fascia was grasped and elevated using a Kocher clamp.  A Veress needle was inserted into the abdomen and the abdomen was insufflated to 15 mmHg.  A 5 mm trocar was inserted in this position under optical guidance and then the abdomen was inspected.  There was no trauma to the underlying viscera with initial trocar placement.  Any abnormal findings, other than inflammation in the right upper quadrant, are listed above in the findings section.  Three additional trocars were placed, one 12 mm trocar in the subxiphoid position, one 5 mm trocar in the midline epigastric area and one 62mm trocar in the right upper quadrant subcostally.  These were placed under direct vision without any trauma to the underlying viscera.    The patient was then placed in head up, left side down positioning.  The gallbladder was identified and dissected free from its attachments to the omentum allowing the duodenum to fall away.  The infundibulum of the gallbladder was dissected free working laterally to medially.  The cystic duct and cystic artery were dissected free from surrounding connective tissue.  The infundibulum of the gallbladder was dissected off the cystic plate.  A critical view of safety was obtained  with the cystic duct and cystic artery being cleared of connective tissues and clearly the only two structures entering into the gallbladder with the liver clearly visible behind.  Clips were then  applied to the cystic duct and cystic artery and then these structures were divided.  A PDS endoloop was placed on the cystic duct.  The gallbladder was dissected off the cystic plate, placed in an endocatch bag and removed from the 12 mm subxiphoid port site.  The clips were inspected and appeared effective.  The cystic plate was inspected and hemostasis was obtained using electrocautery and snow was placed in the gallbladder fossa.  A suction irrigator was used to clean the operative field.  Attention was turned to closure.  The 12 mm subxiphoid port site was closed using a 0-vicryl suture on a fascial suture passer.  The abdomen was desufflated.  The skin was closed using 4-0 monocryl and dermabond.  All sponge and needle counts were correct at the conclusion of the case.    Louanna Raw, MD General, Bariatric, & Minimally Invasive Surgery [facility], [place]

## 2021-03-15 NOTE — Anesthesia Postprocedure Evaluation (Signed)
Anesthesia Post Note  Patient: ARVIN ABELLO  Procedure(s) Performed: LAPAROSCOPIC CHOLECYSTECTOMY     Patient location during evaluation: PACU Anesthesia Type: General Level of consciousness: awake and alert Pain management: pain level controlled Vital Signs Assessment: post-procedure vital signs reviewed and stable Respiratory status: spontaneous breathing, nonlabored ventilation, respiratory function stable and patient connected to nasal cannula oxygen Cardiovascular status: blood pressure returned to baseline and stable Postop Assessment: no apparent nausea or vomiting Anesthetic complications: no   No notable events documented.  Last Vitals:  Vitals:   03/15/21 1130 03/15/21 1156  BP: (!) 160/82 (!) 152/82  Pulse: 82 79  Resp: 10 16  Temp: 36.5 C 36.6 C  SpO2: 93% 95%    Last Pain:  Vitals:   03/15/21 1156  TempSrc: Oral  PainSc: Asleep                 Tiajuana Amass

## 2021-03-16 ENCOUNTER — Encounter (HOSPITAL_COMMUNITY): Payer: Self-pay | Admitting: Surgery

## 2021-03-16 LAB — SURGICAL PATHOLOGY

## 2021-07-19 DIAGNOSIS — L821 Other seborrheic keratosis: Secondary | ICD-10-CM | POA: Diagnosis not present

## 2021-07-19 DIAGNOSIS — L570 Actinic keratosis: Secondary | ICD-10-CM | POA: Diagnosis not present

## 2021-07-19 DIAGNOSIS — L814 Other melanin hyperpigmentation: Secondary | ICD-10-CM | POA: Diagnosis not present

## 2021-07-19 DIAGNOSIS — D1801 Hemangioma of skin and subcutaneous tissue: Secondary | ICD-10-CM | POA: Diagnosis not present

## 2021-10-03 DIAGNOSIS — H905 Unspecified sensorineural hearing loss: Secondary | ICD-10-CM | POA: Diagnosis not present

## 2021-10-04 DIAGNOSIS — Z0000 Encounter for general adult medical examination without abnormal findings: Secondary | ICD-10-CM | POA: Diagnosis not present

## 2021-10-04 DIAGNOSIS — Z1159 Encounter for screening for other viral diseases: Secondary | ICD-10-CM | POA: Diagnosis not present

## 2021-10-04 DIAGNOSIS — R7301 Impaired fasting glucose: Secondary | ICD-10-CM | POA: Diagnosis not present

## 2021-10-04 DIAGNOSIS — Z79899 Other long term (current) drug therapy: Secondary | ICD-10-CM | POA: Diagnosis not present

## 2021-10-04 DIAGNOSIS — Z136 Encounter for screening for cardiovascular disorders: Secondary | ICD-10-CM | POA: Diagnosis not present

## 2021-10-04 DIAGNOSIS — M5431 Sciatica, right side: Secondary | ICD-10-CM | POA: Diagnosis not present

## 2021-10-11 ENCOUNTER — Other Ambulatory Visit: Payer: Self-pay

## 2021-10-11 ENCOUNTER — Ambulatory Visit: Payer: Medicare Other | Attending: Family Medicine

## 2021-10-11 DIAGNOSIS — M6281 Muscle weakness (generalized): Secondary | ICD-10-CM | POA: Diagnosis not present

## 2021-10-11 DIAGNOSIS — R262 Difficulty in walking, not elsewhere classified: Secondary | ICD-10-CM | POA: Diagnosis not present

## 2021-10-11 DIAGNOSIS — M5459 Other low back pain: Secondary | ICD-10-CM | POA: Diagnosis not present

## 2021-10-11 DIAGNOSIS — R252 Cramp and spasm: Secondary | ICD-10-CM | POA: Diagnosis not present

## 2021-10-11 NOTE — Therapy (Signed)
OUTPATIENT PHYSICAL THERAPY THORACOLUMBAR EVALUATION   Patient Name: Anthony Schneider MRN: [id number] DOB:1955-09-26, 66 y.o., male Today's Date: 10/11/2021   PT End of Session - 10/11/21 1413     Visit Number 1    Date for PT Re-Evaluation 12/06/21    Authorization Type UHC    PT Start Time 1400    PT Stop Time 8099    PT Time Calculation (min) 39 min    Activity Tolerance Patient tolerated treatment well    Behavior During Therapy WFL for tasks assessed/performed             Past Medical History:  Diagnosis Date   Hypogonadism male    Past Surgical History:  Procedure Laterality Date   CHOLECYSTECTOMY N/A 03/15/2021   Procedure: LAPAROSCOPIC CHOLECYSTECTOMY;  Surgeon: Felicie Morn, MD;  Location: [facility];  Service: General;  Laterality: N/A;   ERCP N/A 03/14/2021   Procedure: ENDOSCOPIC RETROGRADE CHOLANGIOPANCREATOGRAPHY (ERCP);  Surgeon: Clarene Essex, MD;  Location: [facility];  Service: Endoscopy;  Laterality: N/A;   LAPAROSCOPIC APPENDECTOMY     PROSTATE BIOPSY     REMOVAL OF STONES  03/14/2021   Procedure: REMOVAL OF STONES;  Surgeon: Clarene Essex, MD;  Location: [facility];  Service: Endoscopy;;   SPHINCTEROTOMY  03/14/2021   Procedure: Joan Mayans;  Surgeon: Clarene Essex, MD;  Location: [facility];  Service: Endoscopy;;   TONSILLECTOMY AND ADENOIDECTOMY     Patient Active Problem List   Diagnosis Date Noted   Other low back pain 10/11/2021   Choledocholithiasis 03/15/2021   Elevated LFTs 03/14/2021   GERD (gastroesophageal reflux disease) 03/14/2021   BPH (benign prostatic hyperplasia) 03/14/2021   OSA (obstructive sleep apnea) 03/14/2021   Allergic reaction caused by a drug 03/14/2021   Cholangitis 03/13/2021   Sepsis secondary to UTI ([place]) 04/02/2013    PCP: Lujean Amel, MD   REFERRING PROVIDER: Lujean Amel, MD   REFERRING DIAG:  M54.31 (ICD-10-CM) - Sciatica, right side    Rationale for Evaluation and Treatment  Rehabilitation  THERAPY DIAG:  Other low back pain  Muscle weakness (generalized)  Difficulty in walking, not elsewhere classified  Cramp and spasm  ONSET DATE: 10/10/2021   SUBJECTIVE:                                                                                                                                                                                           SUBJECTIVE STATEMENT: Patient states he had hx of some mild low back pain with right side sciatica which was well controlled.  He has been having some increased pain localized to the low back in the past  few weeks that has interfered with his routine daily activities.  He states that he is unable to squat and get back up due to the pain making his outdoor activities difficult.  He hopes to get his back symptoms under control and avoid this worsening.    PERTINENT HISTORY:  none  PAIN:  Are you having pain? Yes: NPRS scale: 0/10 Pain location: Low back comes and goes Pain description: aching/ tight Aggravating factors: sitting, bending, stooping, squatting Relieving factors: walking, rest   PRECAUTIONS: None  WEIGHT BEARING RESTRICTIONS No  FALLS:  Has patient fallen in last 6 months? No  LIVING ENVIRONMENT: Lives with: lives with their spouse Lives in: House/apartment   OCCUPATION: Retired  PLOF: Independent, Independent with basic ADLs, Independent with household mobility without device, Independent with community mobility without device, Independent with homemaking with ambulation, Independent with gait, and Independent with transfers  PATIENT GOALS He hopes to get his back symptoms under control and avoid this worsening.     OBJECTIVE:   DIAGNOSTIC FINDINGS:  none  PATIENT SURVEYS:  FOTO 61 (goal is 25)  SCREENING FOR RED FLAGS: Bowel or bladder incontinence: No Spinal tumors: No Cauda equina syndrome: No Compression fracture: No Abdominal aneurysm: No  COGNITION:  Overall cognitive  status: Within functional limits for tasks assessed     SENSATION: WFL  MUSCLE LENGTH: Hamstrings: Right 70 deg; Left 45 deg Thomas test: Right pos; Left pos  POSTURE: rounded shoulders, forward head, and decreased lumbar lordosis  PALPATION: Symmetrical, no significant asymmetries, non tender at low back  LUMBAR ROM:   Active  A/PROM  eval  Flexion WNL  Extension WNL  Right lateral flexion WNL  Left lateral flexion WNL  Right rotation WNL  Left rotation WNL   (Blank rows = not tested)  LOWER EXTREMITY ROM:     WFL  LOWER EXTREMITY MMT:    ALL WFL  LUMBAR SPECIAL TESTS:  Straight leg raise test: Negative and Slump test: Negative  FUNCTIONAL TESTS:  5 times sit to stand: do next visit Timed up and go (TUG): do next visit  GAIT: Distance walked: 50 Assistive device utilized: None Level of assistance: Complete Independence Comments: Normal heel to toe progression    TODAY'S TREATMENT  Initial eval completed and initiated HEP   PATIENT EDUCATION:  Education details: Initiated HEP Person educated: Patient Education method: Consulting civil engineer, Media planner, Verbal cues, and Handouts Education comprehension: verbalized understanding, returned demonstration, and verbal cues required   HOME EXERCISE PROGRAM: Access Code: [email or web address] URL: [email or web address] Date: 10/11/2021 Prepared by: Candyce Churn  Exercises - Standing Hamstring Stretch on Chair  - 2 x daily - 7 x weekly - 1 sets - 3 reps - 30 hold - Standing Hip Flexor Stretch with Foot Elevated  - 2 x daily - 7 x weekly - 1 sets - 3 reps - 30 hold - Supine Figure 4 Piriformis Stretch  - 2 x daily - 7 x weekly - 1 sets - 3 reps - 30 hold  ASSESSMENT:  CLINICAL IMPRESSION: Patient is a 66 y.o. male who was seen today for physical therapy evaluation and treatment for low back pain with sciatica.  He presents with normal Lumbar ROM but with pain on fwd flexion.  He has significant tightness  in the left LE vs. Right.  Pain is transient but has become debilitating at times per patient history.  He would benefit from LE flexibility, core stabilization and pain control.     OBJECTIVE IMPAIRMENTS difficulty walking, decreased  ROM, decreased strength, increased fascial restrictions, increased muscle spasms, impaired flexibility, and pain.   ACTIVITY LIMITATIONS carrying, lifting, bending, sitting, standing, squatting, sleeping, stairs, transfers, and dressing  PARTICIPATION LIMITATIONS: meal prep, cleaning, laundry, driving, shopping, community activity, and yard work  [place] and 1-2 comorbidities: recent surgery: gall bladder, GERD, OSA  are also affecting patient's functional outcome.   REHAB POTENTIAL: Good  CLINICAL DECISION MAKING: Stable/uncomplicated  EVALUATION COMPLEXITY: Low   GOALS: Goals reviewed with patient? Yes  SHORT TERM GOALS: Target date: 11/08/2021  Patient will be independent with initial HEP  Baseline: Goal status: INITIAL  2.  Pain report to be no greater than 4/10  Baseline:  Goal status: INITIAL  3.  FOTO to improve by 3 points Baseline:  Goal status: INITIAL   LONG TERM GOALS: Target date: 12/06/2021  Patient to be independent with advanced HEP  Baseline:  Goal status: INITIAL  2.  Patient to report pain no greater than 2/10  Baseline:  Goal status: INITIAL  3.  FOTO to improve to expected outcome Baseline:  Goal status: INITIAL  4.  Patients TUG score and 5 times sit to stand to improve by 5 seconds Baseline:  Goal status: INITIAL  5.  Patient to report 85% improvement in overall symptoms and function Baseline:  Goal status: INITIAL  6.  Patient to be able to rise from a squat position with increased ease Baseline:  Goal status: INITIAL   PLAN: PT FREQUENCY: 1-2x/week  PT DURATION: 8 weeks  PLANNED INTERVENTIONS: Therapeutic exercises, Therapeutic activity, Neuromuscular re-education, Balance  training, Gait training, Patient/Family education, Self Care, Joint mobilization, Stair training, Aquatic Therapy, Dry Needling, Electrical stimulation, Spinal mobilization, Cryotherapy, Moist heat, Taping, Traction, Ultrasound, Manual therapy, and Re-evaluation.  PLAN FOR NEXT SESSION: Review HEP, Nustep, begin core stabilization   Klayten Jolliff B. Keon Pender, PT 10/11/21 7:34 PM  [facility] [address], [place],  [address] Phone # [phone] Fax [phone]

## 2021-10-17 DIAGNOSIS — K635 Polyp of colon: Secondary | ICD-10-CM | POA: Diagnosis not present

## 2021-10-17 DIAGNOSIS — Z800 Family history of malignant neoplasm of digestive organs: Secondary | ICD-10-CM | POA: Diagnosis not present

## 2021-10-17 DIAGNOSIS — Z09 Encounter for follow-up examination after completed treatment for conditions other than malignant neoplasm: Secondary | ICD-10-CM | POA: Diagnosis not present

## 2021-10-17 DIAGNOSIS — K5730 Diverticulosis of large intestine without perforation or abscess without bleeding: Secondary | ICD-10-CM | POA: Diagnosis not present

## 2021-10-17 DIAGNOSIS — Z86010 Personal history of colonic polyps: Secondary | ICD-10-CM | POA: Diagnosis not present

## 2021-10-17 DIAGNOSIS — D125 Benign neoplasm of sigmoid colon: Secondary | ICD-10-CM | POA: Diagnosis not present

## 2021-10-17 DIAGNOSIS — K649 Unspecified hemorrhoids: Secondary | ICD-10-CM | POA: Diagnosis not present

## 2021-10-18 DIAGNOSIS — R350 Frequency of micturition: Secondary | ICD-10-CM | POA: Diagnosis not present

## 2021-10-19 DIAGNOSIS — D125 Benign neoplasm of sigmoid colon: Secondary | ICD-10-CM | POA: Diagnosis not present

## 2021-10-22 ENCOUNTER — Ambulatory Visit: Payer: Medicare Other | Admitting: Physical Therapy

## 2021-10-24 DIAGNOSIS — Z713 Dietary counseling and surveillance: Secondary | ICD-10-CM | POA: Diagnosis not present

## 2021-10-25 ENCOUNTER — Ambulatory Visit: Payer: Medicare Other | Attending: Family Medicine

## 2021-10-25 DIAGNOSIS — M5459 Other low back pain: Secondary | ICD-10-CM | POA: Insufficient documentation

## 2021-10-25 DIAGNOSIS — R262 Difficulty in walking, not elsewhere classified: Secondary | ICD-10-CM | POA: Diagnosis not present

## 2021-10-25 DIAGNOSIS — R252 Cramp and spasm: Secondary | ICD-10-CM | POA: Diagnosis not present

## 2021-10-25 DIAGNOSIS — M6281 Muscle weakness (generalized): Secondary | ICD-10-CM | POA: Diagnosis not present

## 2021-10-25 NOTE — Therapy (Signed)
OUTPATIENT PHYSICAL THERAPY THORACOLUMBAR TREATMENT NOTE   Patient Name: Anthony Schneider MRN: [id number] DOB:02-10-1956, 66 y.o., male Today's Date: 10/25/2021   PT End of Session - 10/25/21 0849     Visit Number 2    Date for PT Re-Evaluation 12/06/21    Authorization Type UHC    PT Start Time 0850    PT Stop Time 0928    PT Time Calculation (min) 38 min    Activity Tolerance Patient tolerated treatment well    Behavior During Therapy [facility] for tasks assessed/performed             Past Medical History:  Diagnosis Date   Hypogonadism male    Past Surgical History:  Procedure Laterality Date   CHOLECYSTECTOMY N/A 03/15/2021   Procedure: LAPAROSCOPIC CHOLECYSTECTOMY;  Surgeon: Felicie Morn, MD;  Location: [facility];  Service: General;  Laterality: N/A;   ERCP N/A 03/14/2021   Procedure: ENDOSCOPIC RETROGRADE CHOLANGIOPANCREATOGRAPHY (ERCP);  Surgeon: Clarene Essex, MD;  Location: [facility];  Service: Endoscopy;  Laterality: N/A;   LAPAROSCOPIC APPENDECTOMY     PROSTATE BIOPSY     REMOVAL OF STONES  03/14/2021   Procedure: REMOVAL OF STONES;  Surgeon: Clarene Essex, MD;  Location: [facility];  Service: Endoscopy;;   SPHINCTEROTOMY  03/14/2021   Procedure: Joan Mayans;  Surgeon: Clarene Essex, MD;  [facility]: [facility];  Service: Endoscopy;;   TONSILLECTOMY AND ADENOIDECTOMY     Patient Active Problem List   Diagnosis Date Noted   Other low back pain 10/11/2021   Choledocholithiasis 03/15/2021   Elevated LFTs 03/14/2021   GERD (gastroesophageal reflux disease) 03/14/2021   BPH (benign prostatic hyperplasia) 03/14/2021   OSA (obstructive sleep apnea) 03/14/2021   Allergic reaction caused by a drug 03/14/2021   Cholangitis 03/13/2021   Sepsis secondary to UTI ([place]) 04/02/2013    PCP: Lujean Amel, MD   REFERRING PROVIDER: Lujean Amel, MD   REFERRING DIAG:  M54.31 (ICD-10-CM) - Sciatica, right side    Rationale for Evaluation and Treatment  Rehabilitation  THERAPY DIAG:  Other low back pain  Muscle weakness (generalized)  Difficulty in walking, not elsewhere classified  Cramp and spasm  ONSET DATE: 10/10/2021   SUBJECTIVE:                                                                                                                                                                                           SUBJECTIVE STATEMENT: Patient states he is doing well.  "My back feels better and the stretches are really helping"  PERTINENT HISTORY:  none  PAIN:  Are you having pain? Yes: NPRS scale: 0/10 Pain  location: Low back comes and goes Pain description: aching/ tight Aggravating factors: sitting, bending, stooping, squatting Relieving factors: walking, rest   PRECAUTIONS: None  WEIGHT BEARING RESTRICTIONS No  FALLS:  Has patient fallen in last 6 months? No  LIVING ENVIRONMENT: Lives with: lives with their spouse Lives in: House/apartment   OCCUPATION: Retired  PLOF: Independent, Independent with basic ADLs, Independent with household mobility without device, Independent with community mobility without device, Independent with homemaking with ambulation, Independent with gait, and Independent with transfers  PATIENT GOALS He hopes to get his back symptoms under control and avoid this worsening.     OBJECTIVE:   DIAGNOSTIC FINDINGS:  none  PATIENT SURVEYS:  FOTO 61 (goal is 43)  SCREENING FOR RED FLAGS: Bowel or bladder incontinence: No Spinal tumors: No Cauda equina syndrome: No Compression fracture: No Abdominal aneurysm: No  COGNITION:  Overall cognitive status: Within functional limits for tasks assessed     SENSATION: WFL  MUSCLE LENGTH: Hamstrings: Right 70 deg; Left 45 deg Thomas test: Right pos; Left pos  POSTURE: rounded shoulders, forward head, and decreased lumbar lordosis  PALPATION: Symmetrical, no significant asymmetries, non tender at low back  LUMBAR ROM:    Active  A/PROM  eval  Flexion WNL  Extension WNL  Right lateral flexion WNL  Left lateral flexion WNL  Right rotation WNL  Left rotation WNL   (Blank rows = not tested)  LOWER EXTREMITY ROM:     WFL  LOWER EXTREMITY MMT:    ALL WFL  LUMBAR SPECIAL TESTS:  Straight leg raise test: Negative and Slump test: Negative  FUNCTIONAL TESTS:  5 times sit to stand: do next visit Timed up and go (TUG): do next visit  GAIT: Distance walked: 50 Assistive device utilized: None Level of assistance: Complete Independence Comments: Normal heel to toe progression    TODAY'S TREATMENT  10/25/21: NuStep x 5 min level 5 Standing hamstring stretch 3 x 30 sec Standing quad/hip flexor stretch 3 x 30 sec Supine piriformis stretch 2 x 30 sec PPT x 20 PPT with 90/90 heel tap x 20 PPT with dying bug x 20 Cat/camel x 20 Quadruped hip extension x 20 alternating  10/11/21: Initial eval completed and initiated HEP   PATIENT EDUCATION:  Education details: Initiated HEP Person educated: Patient Education method: Consulting civil engineer, Media planner, Verbal cues, and Handouts Education comprehension: verbalized understanding, returned demonstration, and verbal cues required   HOME EXERCISE PROGRAM: Access Code: [email or web address] URL: [email or web address] Date: 10/25/2021 Prepared by: Candyce Churn  Exercises - Standing Hamstring Stretch on Chair  - 2 x daily - 7 x weekly - 1 sets - 3 reps - 30 hold - Standing Hip Flexor Stretch with Foot Elevated  - 2 x daily - 7 x weekly - 1 sets - 3 reps - 30 hold - Supine Figure 4 Piriformis Stretch  - 2 x daily - 7 x weekly - 1 sets - 3 reps - 30 hold - Supine Posterior Pelvic Tilt  - 1 x daily - 7 x weekly - 1 sets - 20 reps - Supine 90/90 Alternating Heel Touches with Posterior Pelvic Tilt  - 1 x daily - 7 x weekly - 3 sets - 10 reps - Supine Dead Bug with Leg Extension  - 1 x daily - 7 x weekly - 3 sets - 10 reps  ASSESSMENT:  CLINICAL  IMPRESSION: Berish responded very well to initial HEP.  He continues to have significant tightness in both hips and has difficulty with finding  his neutral pelvic position indicating weak core.  He tolerated today without increased pain.  He does have some trouble with vertigo so he needs a balance pad under the pillow when in supine.    He would benefit from continued skilled PT for LE flexibility, core stabilization and pain control.     OBJECTIVE IMPAIRMENTS difficulty walking, decreased ROM, decreased strength, increased fascial restrictions, increased muscle spasms, impaired flexibility, and pain.   ACTIVITY LIMITATIONS carrying, lifting, bending, sitting, standing, squatting, sleeping, stairs, transfers, and dressing  PARTICIPATION LIMITATIONS: meal prep, cleaning, laundry, driving, shopping, community activity, and yard work  [place] and 1-2 comorbidities: recent surgery: gall bladder, GERD, OSA  are also affecting patient's functional outcome.   REHAB POTENTIAL: Good  CLINICAL DECISION MAKING: Stable/uncomplicated  EVALUATION COMPLEXITY: Low   GOALS: Goals reviewed with patient? Yes  SHORT TERM GOALS: Target date: 11/08/2021  Patient will be independent with initial HEP  Baseline: Goal status: INITIAL  2.  Pain report to be no greater than 4/10  Baseline:  Goal status: INITIAL  3.  FOTO to improve by 3 points Baseline:  Goal status: INITIAL   LONG TERM GOALS: Target date: 12/06/2021  Patient to be independent with advanced HEP  Baseline:  Goal status: INITIAL  2.  Patient to report pain no greater than 2/10  Baseline:  Goal status: INITIAL  3.  FOTO to improve to expected outcome Baseline:  Goal status: INITIAL  4.  Patients TUG score and 5 times sit to stand to improve by 5 seconds Baseline:  Goal status: INITIAL  5.  Patient to report 85% improvement in overall symptoms and function Baseline:  Goal status: INITIAL  6.  Patient to be  able to rise from a squat position with increased ease Baseline:  Goal status: INITIAL   PLAN: PT FREQUENCY: 1-2x/week  PT DURATION: 8 weeks  PLANNED INTERVENTIONS: Therapeutic exercises, Therapeutic activity, Neuromuscular re-education, Balance training, Gait training, Patient/Family education, Self Care, Joint mobilization, Stair training, Aquatic Therapy, Dry Needling, Electrical stimulation, Spinal mobilization, Cryotherapy, Moist heat, Taping, Traction, Ultrasound, Manual therapy, and Re-evaluation.  PLAN FOR NEXT SESSION:  Nustep, progress core stabilization, continue LE flexibility   Catlin Aycock B. Any Mcneice, PT 10/25/21 11:03 AM  [facility] [address] [place], [place] [address] Phone # [phone] Fax [phone]

## 2021-10-29 ENCOUNTER — Ambulatory Visit: Payer: Medicare Other | Admitting: Physical Therapy

## 2021-10-29 ENCOUNTER — Encounter: Payer: Self-pay | Admitting: Physical Therapy

## 2021-10-29 DIAGNOSIS — H6122 Impacted cerumen, left ear: Secondary | ICD-10-CM | POA: Diagnosis not present

## 2021-10-29 DIAGNOSIS — R262 Difficulty in walking, not elsewhere classified: Secondary | ICD-10-CM | POA: Diagnosis not present

## 2021-10-29 DIAGNOSIS — M6281 Muscle weakness (generalized): Secondary | ICD-10-CM

## 2021-10-29 DIAGNOSIS — R252 Cramp and spasm: Secondary | ICD-10-CM | POA: Diagnosis not present

## 2021-10-29 DIAGNOSIS — M5459 Other low back pain: Secondary | ICD-10-CM | POA: Diagnosis not present

## 2021-10-29 DIAGNOSIS — Z23 Encounter for immunization: Secondary | ICD-10-CM | POA: Diagnosis not present

## 2021-10-29 NOTE — Therapy (Signed)
OUTPATIENT PHYSICAL THERAPY THORACOLUMBAR TREATMENT NOTE   Patient Name: Anthony Schneider MRN: [id number] DOB:1955/10/05, 66 y.o., male Today's Date: 10/29/2021   PT End of Session - 10/29/21 0930     Visit Number 3    Date for PT Re-Evaluation 12/06/21    Authorization Type [employer]    PT Start Time 0930    PT Stop Time 1009    PT Time Calculation (min) 39 min    Activity Tolerance Patient tolerated treatment well    Behavior During Therapy WFL for tasks assessed/performed             Past Medical History:  Diagnosis Date   Hypogonadism male    Past Surgical History:  Procedure Laterality Date   CHOLECYSTECTOMY N/A 03/15/2021   Procedure: LAPAROSCOPIC CHOLECYSTECTOMY;  Surgeon: Felicie Morn, MD;  Location: [facility];  Service: General;  Laterality: N/A;   ERCP N/A 03/14/2021   Procedure: ENDOSCOPIC RETROGRADE CHOLANGIOPANCREATOGRAPHY (ERCP);  Surgeon: Clarene Essex, MD;  Location: [facility];  Service: Endoscopy;  Laterality: N/A;   LAPAROSCOPIC APPENDECTOMY     PROSTATE BIOPSY     REMOVAL OF STONES  03/14/2021   Procedure: REMOVAL OF STONES;  Surgeon: Clarene Essex, MD;  Location: [facility];  Service: Endoscopy;;   SPHINCTEROTOMY  03/14/2021   Procedure: Joan Mayans;  Surgeon: Clarene Essex, MD;  Location: [facility];  Service: Endoscopy;;   TONSILLECTOMY AND ADENOIDECTOMY     Patient Active Problem List   Diagnosis Date Noted   Other low back pain 10/11/2021   Choledocholithiasis 03/15/2021   Elevated LFTs 03/14/2021   GERD (gastroesophageal reflux disease) 03/14/2021   BPH (benign prostatic hyperplasia) 03/14/2021   OSA (obstructive sleep apnea) 03/14/2021   Allergic reaction caused by a drug 03/14/2021   Cholangitis 03/13/2021   Sepsis secondary to UTI ([place]) 04/02/2013    PCP: Lujean Amel, MD   REFERRING PROVIDER: Lujean Amel, MD   REFERRING DIAG:  M54.31 (ICD-10-CM) - Sciatica, right side    Rationale for Evaluation and Treatment  Rehabilitation  THERAPY DIAG:  Other low back pain  Muscle weakness (generalized)  Difficulty in walking, not elsewhere classified  Cramp and spasm  ONSET DATE: 10/10/2021   SUBJECTIVE:                                                                                                                                                                                           SUBJECTIVE STATEMENT: My back muscles were pretty sore for about a day after last session but now I am ok.   PERTINENT HISTORY:  none  PAIN:  Are you having pain? Yes: NPRS scale:  0/10 Pain location: Low back comes and goes Pain description: aching/ tight Aggravating factors: sitting, bending, stooping, squatting Relieving factors: walking, rest   PRECAUTIONS: None  WEIGHT BEARING RESTRICTIONS No  FALLS:  Has patient fallen in last 6 months? No  LIVING ENVIRONMENT: Lives with: lives with their spouse Lives in: House/apartment   OCCUPATION: Retired  PLOF: Independent, Independent with basic ADLs, Independent with household mobility without device, Independent with community mobility without device, Independent with homemaking with ambulation, Independent with gait, and Independent with transfers  PATIENT GOALS He hopes to get his back symptoms under control and avoid this worsening.     OBJECTIVE:   DIAGNOSTIC FINDINGS:  none  PATIENT SURVEYS:  FOTO 61 (goal is 49)  SCREENING FOR RED FLAGS: Bowel or bladder incontinence: No Spinal tumors: No Cauda equina syndrome: No Compression fracture: No Abdominal aneurysm: No  COGNITION:  Overall cognitive status: Within functional limits for tasks assessed     SENSATION: WFL  MUSCLE LENGTH: Hamstrings: Right 70 deg; Left 45 deg Thomas test: Right pos; Left pos  POSTURE: rounded shoulders, forward head, and decreased lumbar lordosis  PALPATION: Symmetrical, no significant asymmetries, non tender at low back  LUMBAR ROM:   Active   A/PROM  eval  Flexion WNL  Extension WNL  Right lateral flexion WNL  Left lateral flexion WNL  Right rotation WNL  Left rotation WNL   (Blank rows = not tested)  LOWER EXTREMITY ROM:     WFL  LOWER EXTREMITY MMT:    ALL WFL  LUMBAR SPECIAL TESTS:  Straight leg raise test: Negative and Slump test: Negative  FUNCTIONAL TESTS:  5 times sit to stand: do next visit Timed up and go (TUG): do next visit  GAIT: Distance walked: 50 Assistive device utilized: None Level of assistance: Complete Independence Comments: Normal heel to toe progression    TODAY'S TREATMENT   10/29/21:  NuStep x 5 min level 5 PTA present to discuss status Supine Hand to Big toe with strap Bil: Intro hip add/abd AROM today Supine hip AROM for Ext rot 2x10 then piriformis stretch 2 x 30 sec Supine post pelvic tilt 10x, then 90/90 3x, add heel taps 5x     10/25/21: NuStep x 5 min level 5 Standing hamstring stretch 3 x 30 sec Standing quad/hip flexor stretch 3 x 30 sec Supine piriformis stretch 2 x 30 sec PPT x 20 PPT with 90/90 heel tap x 20 PPT with dying bug x 20 Cat/camel x 20 Quadruped hip extension x 20 alternating  10/11/21: Initial eval completed and initiated HEP   PATIENT EDUCATION:  Education details: Initiated HEP Person educated: Patient Education method: Consulting civil engineer, Media planner, Verbal cues, and Handouts Education comprehension: verbalized understanding, returned demonstration, and verbal cues required   HOME EXERCISE PROGRAM: Access Code: [email or web address] URL: [email or web address] Date: 10/25/2021 Prepared by: Candyce Churn  Exercises - Standing Hamstring Stretch on Chair  - 2 x daily - 7 x weekly - 1 sets - 3 reps - 30 hold - Standing Hip Flexor Stretch with Foot Elevated  - 2 x daily - 7 x weekly - 1 sets - 3 reps - 30 hold - Supine Figure 4 Piriformis Stretch  - 2 x daily - 7 x weekly - 1 sets - 3 reps - 30 hold - Supine Posterior Pelvic Tilt  - 1 x  daily - 7 x weekly - 1 sets - 20 reps - Supine 90/90 Alternating Heel Touches with Posterior Pelvic Tilt  - 1 x daily -  7 x weekly - 3 sets - 10 reps - Supine Dead Bug with Leg Extension  - 1 x daily - 7 x weekly - 3 sets - 10 reps  ASSESSMENT:  CLINICAL IMPRESSION: Pt arrives to PT with no pain. He was sore in his back muscles for about a day after his last session. This absolved and now gets no soreness with his new exercises despite still challenging his core. We worked on moving his hips in all planes today, rotation being most limited. Pt improving in core strength.  OBJECTIVE IMPAIRMENTS difficulty walking, decreased ROM, decreased strength, increased fascial restrictions, increased muscle spasms, impaired flexibility, and pain.   ACTIVITY LIMITATIONS carrying, lifting, bending, sitting, standing, squatting, sleeping, stairs, transfers, and dressing  PARTICIPATION LIMITATIONS: meal prep, cleaning, laundry, driving, shopping, community activity, and yard work  [place] and 1-2 comorbidities: recent surgery: gall bladder, GERD, OSA  are also affecting patient's functional outcome.   REHAB POTENTIAL: Good  CLINICAL DECISION MAKING: Stable/uncomplicated  EVALUATION COMPLEXITY: Low   GOALS: Goals reviewed with patient? Yes  SHORT TERM GOALS: Target date: 11/08/2021  Patient will be independent with initial HEP  Baseline: Goal status: Goal met 10/29/21  2.  Pain report to be no greater than 4/10  Baseline:  Goal status: INITIAL  3.  FOTO to improve by 3 points Baseline:  Goal status: INITIAL   LONG TERM GOALS: Target date: 12/06/2021  Patient to be independent with advanced HEP  Baseline:  Goal status: INITIAL  2.  Patient to report pain no greater than 2/10  Baseline:  Goal status: INITIAL  3.  FOTO to improve to expected outcome Baseline:  Goal status: INITIAL  4.  Patients TUG score and 5 times sit to stand to improve by 5 seconds Baseline:   Goal status: INITIAL  5.  Patient to report 85% improvement in overall symptoms and function Baseline:  Goal status: INITIAL  6.  Patient to be able to rise from a squat position with increased ease Baseline:  Goal status: INITIAL   PLAN: PT FREQUENCY: 1-2x/week  PT DURATION: 8 weeks  PLANNED INTERVENTIONS: Therapeutic exercises, Therapeutic activity, Neuromuscular re-education, Balance training, Gait training, Patient/Family education, Self Care, Joint mobilization, Stair training, Aquatic Therapy, Dry Needling, Electrical stimulation, Spinal mobilization, Cryotherapy, Moist heat, Taping, Traction, Ultrasound, Manual therapy, and Re-evaluation.  PLAN FOR NEXT SESSION:  Discuss POC with Pt, pt has no more appts after this week.   Myrene Galas, PTA 10/29/21 10:08 AM [facility]  [address], [place], [place] [address] Phone # [phone] Fax [phone]

## 2021-11-01 ENCOUNTER — Ambulatory Visit: Payer: Medicare Other

## 2021-11-01 DIAGNOSIS — M5459 Other low back pain: Secondary | ICD-10-CM

## 2021-11-01 DIAGNOSIS — R252 Cramp and spasm: Secondary | ICD-10-CM

## 2021-11-01 DIAGNOSIS — R262 Difficulty in walking, not elsewhere classified: Secondary | ICD-10-CM

## 2021-11-01 DIAGNOSIS — M6281 Muscle weakness (generalized): Secondary | ICD-10-CM

## 2021-11-01 NOTE — Therapy (Addendum)
OUTPATIENT PHYSICAL THERAPY THORACOLUMBAR TREATMENT NOTE PHYSICAL THERAPY DISCHARGE SUMMARY  Visits from Start of Care: 4  Current functional level related to goals / functional outcomes: See below   Remaining deficits: See below   Education / Equipment: See below   Patient agrees to discharge. Patient goals were partially met. Patient is being discharged due to not returning since the last visit.   Patient Name: Anthony Schneider MRN: [id number] DOB:1955-12-17, 66 y.o., male Today's Date: 11/01/2021   PT End of Session - 11/01/21 0839     Visit Number 4    Date for PT Re-Evaluation 12/06/21    Authorization Type [employer]    PT Start Time 0845    PT Stop Time 0932    PT Time Calculation (min) 47 min    Activity Tolerance Patient tolerated treatment well    Behavior During Therapy [facility] for tasks assessed/performed             Past Medical History:  Diagnosis Date   Hypogonadism male    Past Surgical History:  Procedure Laterality Date   CHOLECYSTECTOMY N/A 03/15/2021   Procedure: LAPAROSCOPIC CHOLECYSTECTOMY;  Surgeon: Felicie Morn, MD;  Location: [facility];  Service: General;  Laterality: N/A;   ERCP N/A 03/14/2021   Procedure: ENDOSCOPIC RETROGRADE CHOLANGIOPANCREATOGRAPHY (ERCP);  Surgeon: Clarene Essex, MD;  Location: [facility];  Service: Endoscopy;  Laterality: N/A;   LAPAROSCOPIC APPENDECTOMY     PROSTATE BIOPSY     REMOVAL OF STONES  03/14/2021   Procedure: REMOVAL OF STONES;  Surgeon: Clarene Essex, MD;  Location: [facility];  Service: Endoscopy;;   SPHINCTEROTOMY  03/14/2021   Procedure: Joan Mayans;  Surgeon: Clarene Essex, MD;  Location: [facility];  Service: Endoscopy;;   TONSILLECTOMY AND ADENOIDECTOMY     Patient Active Problem List   Diagnosis Date Noted   Other low back pain 10/11/2021   Choledocholithiasis 03/15/2021   Elevated LFTs 03/14/2021   GERD (gastroesophageal reflux disease) 03/14/2021   BPH (benign prostatic hyperplasia) 03/14/2021   OSA  (obstructive sleep apnea) 03/14/2021   Allergic reaction caused by a drug 03/14/2021   Cholangitis 03/13/2021   Sepsis secondary to UTI ([place]) 04/02/2013    PCP: Lujean Amel, MD   REFERRING PROVIDER: Lujean Amel, MD   REFERRING DIAG:  M54.31 (ICD-10-CM) - Sciatica, right side    Rationale for Evaluation and Treatment Rehabilitation  THERAPY DIAG:  Other low back pain  Muscle weakness (generalized)  Difficulty in walking, not elsewhere classified  Cramp and spasm  ONSET DATE: 10/10/2021   SUBJECTIVE:  SUBJECTIVE STATEMENT: "Pretty sore this morning but once I start moving around, I am fine"  Patient reports feeling about 50% better.  I painted my whole front porch and that made me really stiff and I'd have to stop frequently more for my vertigo symptoms.    PERTINENT HISTORY:  none  PAIN:  Are you having pain? Yes: NPRS scale: 0/10 Pain location: Low back comes and goes Pain description: aching/ tight Aggravating factors: sitting, bending, stooping, squatting Relieving factors: walking, rest   PRECAUTIONS: None  WEIGHT BEARING RESTRICTIONS No  FALLS:  Has patient fallen in last 6 months? No  LIVING ENVIRONMENT: Lives with: lives with their spouse Lives in: House/apartment   OCCUPATION: Retired  PLOF: Independent, Independent with basic ADLs, Independent with household mobility without device, Independent with community mobility without device, Independent with homemaking with ambulation, Independent with gait, and Independent with transfers  PATIENT GOALS He hopes to get his back symptoms under control and avoid this worsening.     OBJECTIVE:   DIAGNOSTIC FINDINGS:  none  PATIENT SURVEYS:  FOTO 61 (goal is 54)  SCREENING FOR RED FLAGS: Bowel or bladder  incontinence: No Spinal tumors: No Cauda equina syndrome: No Compression fracture: No Abdominal aneurysm: No  COGNITION:  Overall cognitive status: Within functional limits for tasks assessed     SENSATION: WFL  MUSCLE LENGTH: Hamstrings: Right 70 deg; Left 45 deg Thomas test: Right pos; Left pos  POSTURE: rounded shoulders, forward head, and decreased lumbar lordosis  PALPATION: Symmetrical, no significant asymmetries, non tender at low back  LUMBAR ROM:   Active  A/PROM  eval  Flexion WNL  Extension WNL  Right lateral flexion WNL  Left lateral flexion WNL  Right rotation WNL  Left rotation WNL   (Blank rows = not tested)  LOWER EXTREMITY ROM:     WFL  LOWER EXTREMITY MMT:    ALL WFL  LUMBAR SPECIAL TESTS:  Straight leg raise test: Negative and Slump test: Negative  FUNCTIONAL TESTS:  5 times sit to stand: 12.32 sec Timed up and go (TUG): 7.87 sec  GAIT: Distance walked: 50 Assistive device utilized: None Level of assistance: Complete Independence Comments: Normal heel to toe progression    TODAY'S TREATMENT     11/01/21:  NuStep x 5 min level 5 PTA present to discuss status Completed functional tests: see above Education on pacing himself with heavier chores to avoid over irritating the lumbar spine and causing wear and tear -taking more breaks and stretching- educated on proper body mechanics Standing hamstring and quad stretches 2 x 30 sec each Supine IT band stretch x 3 hold 30 sec PPT x 20 PPT with 90/90 heel tap x 20 PPT with dying bug x 20   PPT with physio ball pass x 10 Had patient drop head back off edge of table to assess response and possible vertigo as he has been having symptoms with looking up: educated patient on various causes of the light headedness including vertebral artery occlusal and changes in BP along with the typical vertigo Suggested we contact primary MD and request referral to assess vertigo and treat.   PPT with  alternate arm and leg with physioball.  X 10  10/29/21: NuStep x 5 min level 5 PTA present to discuss status Supine Hand to Big toe with strap Bil: Intro hip add/abd AROM today Supine hip AROM for Ext rot 2x10 then piriformis stretch 2 x 30 sec Supine post pelvic tilt 10x, then 90/90 3x, add  heel taps 5x     10/25/21: NuStep x 5 min level 5 Standing hamstring stretch 3 x 30 sec Standing quad/hip flexor stretch 3 x 30 sec Supine piriformis stretch 2 x 30 sec PPT x 20 PPT with 90/90 heel tap x 20 PPT with dying bug x 20 Cat/camel x 20 Quadruped hip extension x 20 alternating  10/11/21: Initial eval completed and initiated HEP   PATIENT EDUCATION:  Education details: Initiated HEP Person educated: Patient Education method: Consulting civil engineer, Media planner, Verbal cues, and Handouts Education comprehension: verbalized understanding, returned demonstration, and verbal cues required   HOME EXERCISE PROGRAM: Access Code: [email or web address] URL: [email or web address] Date: 10/25/2021 Prepared by: Candyce Churn  Exercises - Standing Hamstring Stretch on Chair  - 2 x daily - 7 x weekly - 1 sets - 3 reps - 30 hold - Standing Hip Flexor Stretch with Foot Elevated  - 2 x daily - 7 x weekly - 1 sets - 3 reps - 30 hold - Supine Figure 4 Piriformis Stretch  - 2 x daily - 7 x weekly - 1 sets - 3 reps - 30 hold - Supine Posterior Pelvic Tilt  - 1 x daily - 7 x weekly - 1 sets - 20 reps - Supine 90/90 Alternating Heel Touches with Posterior Pelvic Tilt  - 1 x daily - 7 x weekly - 3 sets - 10 reps - Supine Dead Bug with Leg Extension  - 1 x daily - 7 x weekly - 3 sets - 10 reps  ASSESSMENT:  CLINICAL IMPRESSION: Anthony Schneider is progressing appropriately.  He has experienced more vertigo lately, likely due to [place] placing him in positions that he avoids.  He is more accommodated now and the symptoms aren't as severe but he continues to have dizziness when he looks up.  We are requesting a referral from  his primary MD, who is also the referring MD for his back, to treat assess and treat his vertigo.  He would benefit from continued skilled PT to progress core strengthening and assess his vertigo symptoms.    OBJECTIVE IMPAIRMENTS difficulty walking, decreased ROM, decreased strength, increased fascial restrictions, increased muscle spasms, impaired flexibility, and pain.   ACTIVITY LIMITATIONS carrying, lifting, bending, sitting, standing, squatting, sleeping, stairs, transfers, and dressing  PARTICIPATION LIMITATIONS: meal prep, cleaning, laundry, driving, shopping, community activity, and yard work  [place] and 1-2 comorbidities: recent surgery: gall bladder, GERD, OSA  are also affecting patient's functional outcome.   REHAB POTENTIAL: Good  CLINICAL DECISION MAKING: Stable/uncomplicated  EVALUATION COMPLEXITY: Low   GOALS: Goals reviewed with patient? Yes  SHORT TERM GOALS: Target date: 11/08/2021  Patient will be independent with initial HEP  Baseline: Goal status: Goal met 10/29/21  2.  Pain report to be no greater than 4/10  Baseline:  Goal status: INITIAL  3.  FOTO to improve by 3 points Baseline:  Goal status: INITIAL   LONG TERM GOALS: Target date: 12/06/2021  Patient to be independent with advanced HEP  Baseline:  Goal status: INITIAL  2.  Patient to report pain no greater than 2/10  Baseline:  Goal status: INITIAL  3.  FOTO to improve to expected outcome Baseline:  Goal status: INITIAL  4.  Patients TUG score and 5 times sit to stand to improve by 5 seconds Baseline:  Goal status: INITIAL  5.  Patient to report 85% improvement in overall symptoms and function Baseline:  Goal status: INITIAL  6.  Patient to be able to rise from a  squat position with increased ease Baseline:  Goal status: INITIAL   PLAN: PT FREQUENCY: 1-2x/week  PT DURATION: 8 weeks  PLANNED INTERVENTIONS: Therapeutic exercises, Therapeutic activity,  Neuromuscular re-education, Balance training, Gait training, Patient/Family education, Self Care, Joint mobilization, Stair training, Aquatic Therapy, Dry Needling, Electrical stimulation, Spinal mobilization, Cryotherapy, Moist heat, Taping, Traction, Ultrasound, Manual therapy, and Re-evaluation.  PLAN FOR NEXT SESSION:  continue 2 times per week for low back and assess for vestibular issues.    Anderson Malta B. Kalix Meinecke, PT 11/01/21 10:19 AM  [facility] [address], [place], [place] [address] Phone # [phone] Fax [phone]

## 2022-01-23 DIAGNOSIS — G4733 Obstructive sleep apnea (adult) (pediatric): Secondary | ICD-10-CM | POA: Diagnosis not present

## 2022-02-06 DIAGNOSIS — R058 Other specified cough: Secondary | ICD-10-CM | POA: Diagnosis not present

## 2022-03-13 DIAGNOSIS — U071 COVID-19: Secondary | ICD-10-CM | POA: Diagnosis not present

## 2022-03-26 DIAGNOSIS — U071 COVID-19: Secondary | ICD-10-CM | POA: Diagnosis not present

## 2022-06-10 DIAGNOSIS — K068 Other specified disorders of gingiva and edentulous alveolar ridge: Secondary | ICD-10-CM | POA: Diagnosis not present

## 2022-07-10 DIAGNOSIS — R1032 Left lower quadrant pain: Secondary | ICD-10-CM | POA: Diagnosis not present

## 2022-07-10 DIAGNOSIS — M79652 Pain in left thigh: Secondary | ICD-10-CM | POA: Diagnosis not present

## 2022-07-12 DIAGNOSIS — R3912 Poor urinary stream: Secondary | ICD-10-CM | POA: Diagnosis not present

## 2022-07-12 DIAGNOSIS — R350 Frequency of micturition: Secondary | ICD-10-CM | POA: Diagnosis not present

## 2022-07-22 DIAGNOSIS — L570 Actinic keratosis: Secondary | ICD-10-CM | POA: Diagnosis not present

## 2022-07-22 DIAGNOSIS — D225 Melanocytic nevi of trunk: Secondary | ICD-10-CM | POA: Diagnosis not present

## 2022-07-22 DIAGNOSIS — L814 Other melanin hyperpigmentation: Secondary | ICD-10-CM | POA: Diagnosis not present

## 2022-07-22 DIAGNOSIS — L218 Other seborrheic dermatitis: Secondary | ICD-10-CM | POA: Diagnosis not present

## 2022-07-22 DIAGNOSIS — L821 Other seborrheic keratosis: Secondary | ICD-10-CM | POA: Diagnosis not present

## 2022-07-29 DIAGNOSIS — G4733 Obstructive sleep apnea (adult) (pediatric): Secondary | ICD-10-CM | POA: Diagnosis not present

## 2022-08-13 DIAGNOSIS — R1032 Left lower quadrant pain: Secondary | ICD-10-CM | POA: Diagnosis not present

## 2022-08-19 ENCOUNTER — Other Ambulatory Visit: Payer: Self-pay | Admitting: Family Medicine

## 2022-08-19 DIAGNOSIS — R1032 Left lower quadrant pain: Secondary | ICD-10-CM

## 2022-08-20 ENCOUNTER — Ambulatory Visit
Admission: RE | Admit: 2022-08-20 | Discharge: 2022-08-20 | Disposition: A | Discharge status: Home / Self Care | Payer: Medicare Other | Source: Ambulatory Visit | Attending: Family Medicine | Admitting: Family Medicine

## 2022-08-20 DIAGNOSIS — M79652 Pain in left thigh: Secondary | ICD-10-CM | POA: Diagnosis not present

## 2022-08-20 DIAGNOSIS — R1032 Left lower quadrant pain: Secondary | ICD-10-CM

## 2022-09-10 DIAGNOSIS — M5441 Lumbago with sciatica, right side: Secondary | ICD-10-CM | POA: Diagnosis not present

## 2022-09-12 ENCOUNTER — Ambulatory Visit: Payer: Medicare Other | Admitting: Rehabilitative and Restorative Service Providers"

## 2022-09-12 DIAGNOSIS — M545 Low back pain, unspecified: Secondary | ICD-10-CM | POA: Diagnosis not present

## 2022-09-24 DIAGNOSIS — M4726 Other spondylosis with radiculopathy, lumbar region: Secondary | ICD-10-CM | POA: Diagnosis not present

## 2022-09-25 ENCOUNTER — Ambulatory Visit: Payer: Medicare Other | Admitting: Physical Therapy

## 2022-09-26 DIAGNOSIS — M545 Low back pain, unspecified: Secondary | ICD-10-CM | POA: Diagnosis not present

## 2022-10-01 DIAGNOSIS — M545 Low back pain, unspecified: Secondary | ICD-10-CM | POA: Diagnosis not present

## 2022-10-03 DIAGNOSIS — M4726 Other spondylosis with radiculopathy, lumbar region: Secondary | ICD-10-CM | POA: Diagnosis not present

## 2022-10-15 DIAGNOSIS — R7301 Impaired fasting glucose: Secondary | ICD-10-CM | POA: Diagnosis not present

## 2022-10-15 DIAGNOSIS — E7800 Pure hypercholesterolemia, unspecified: Secondary | ICD-10-CM | POA: Diagnosis not present

## 2022-10-15 DIAGNOSIS — Z79899 Other long term (current) drug therapy: Secondary | ICD-10-CM | POA: Diagnosis not present

## 2022-10-15 DIAGNOSIS — R799 Abnormal finding of blood chemistry, unspecified: Secondary | ICD-10-CM | POA: Diagnosis not present

## 2022-10-15 DIAGNOSIS — M5416 Radiculopathy, lumbar region: Secondary | ICD-10-CM | POA: Diagnosis not present

## 2022-10-16 DIAGNOSIS — Z0001 Encounter for general adult medical examination with abnormal findings: Secondary | ICD-10-CM | POA: Diagnosis not present

## 2022-10-16 DIAGNOSIS — R7303 Prediabetes: Secondary | ICD-10-CM | POA: Diagnosis not present

## 2022-10-16 DIAGNOSIS — Z23 Encounter for immunization: Secondary | ICD-10-CM | POA: Diagnosis not present

## 2022-10-16 DIAGNOSIS — R7301 Impaired fasting glucose: Secondary | ICD-10-CM | POA: Diagnosis not present

## 2022-10-16 DIAGNOSIS — Z79899 Other long term (current) drug therapy: Secondary | ICD-10-CM | POA: Diagnosis not present

## 2022-10-25 DIAGNOSIS — R3912 Poor urinary stream: Secondary | ICD-10-CM | POA: Diagnosis not present

## 2022-10-25 DIAGNOSIS — R350 Frequency of micturition: Secondary | ICD-10-CM | POA: Diagnosis not present

## 2022-11-14 DIAGNOSIS — M5416 Radiculopathy, lumbar region: Secondary | ICD-10-CM | POA: Diagnosis not present

## 2022-12-12 DIAGNOSIS — R3912 Poor urinary stream: Secondary | ICD-10-CM | POA: Diagnosis not present

## 2023-07-18 DIAGNOSIS — F418 Other specified anxiety disorders: Secondary | ICD-10-CM | POA: Diagnosis not present

## 2023-07-23 DIAGNOSIS — D225 Melanocytic nevi of trunk: Secondary | ICD-10-CM | POA: Diagnosis not present

## 2023-07-23 DIAGNOSIS — L821 Other seborrheic keratosis: Secondary | ICD-10-CM | POA: Diagnosis not present

## 2023-07-23 DIAGNOSIS — L814 Other melanin hyperpigmentation: Secondary | ICD-10-CM | POA: Diagnosis not present

## 2023-08-05 DIAGNOSIS — G4733 Obstructive sleep apnea (adult) (pediatric): Secondary | ICD-10-CM | POA: Diagnosis not present

## 2023-08-05 DIAGNOSIS — G4700 Insomnia, unspecified: Secondary | ICD-10-CM | POA: Diagnosis not present

## 2023-08-05 DIAGNOSIS — K219 Gastro-esophageal reflux disease without esophagitis: Secondary | ICD-10-CM | POA: Diagnosis not present

## 2023-08-24 IMAGING — US US ABDOMEN LIMITED
1 series · 15 of 25 positions shown · non-contrast
Comparison: Abdominal ultrasound 03/09/2021.

CLINICAL DATA: Right upper quadrant abdominal pain.

EXAM:
ULTRASOUND ABDOMEN LIMITED RIGHT UPPER QUADRANT

[Series 1: us abdomen limited ruq mc & wl · 55 acquisitions, 15 frames shown]
[im 1/55]
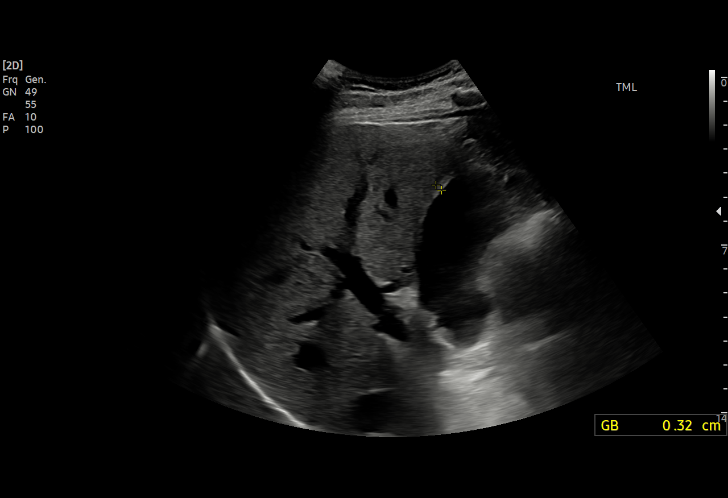
[im 5/55]
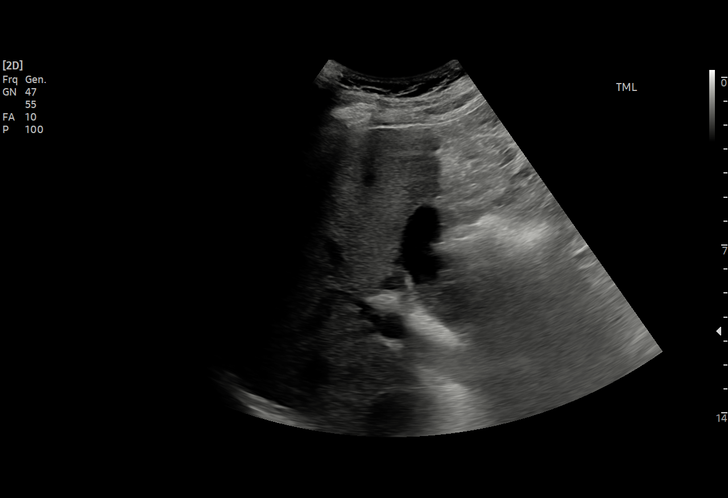
[im 10/55]
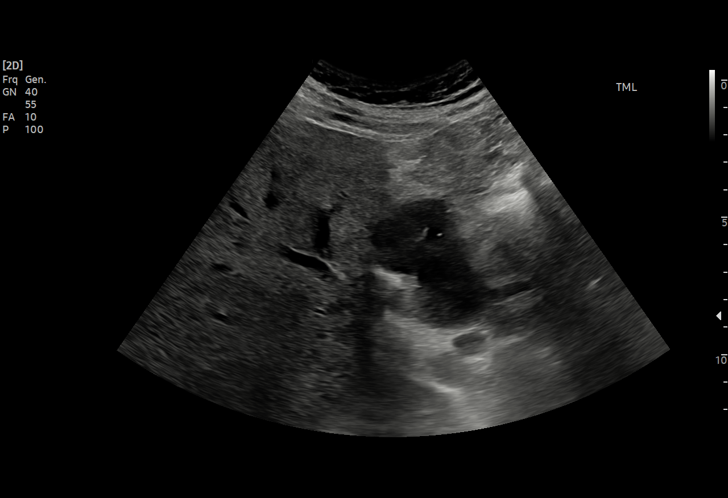
[im 12/55]
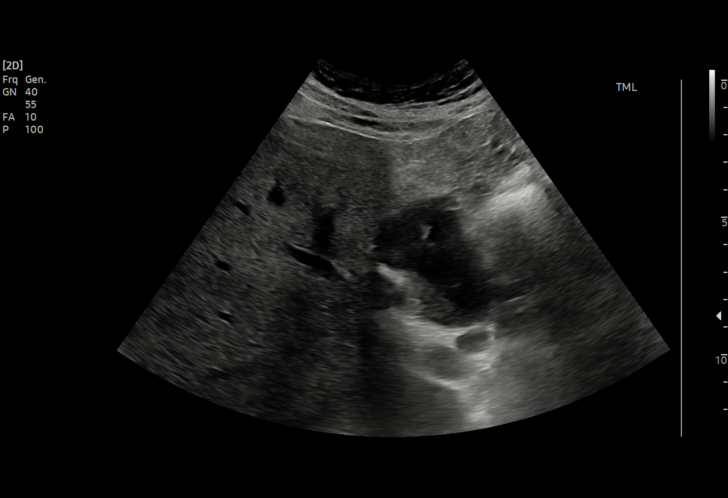
[im 16/55]
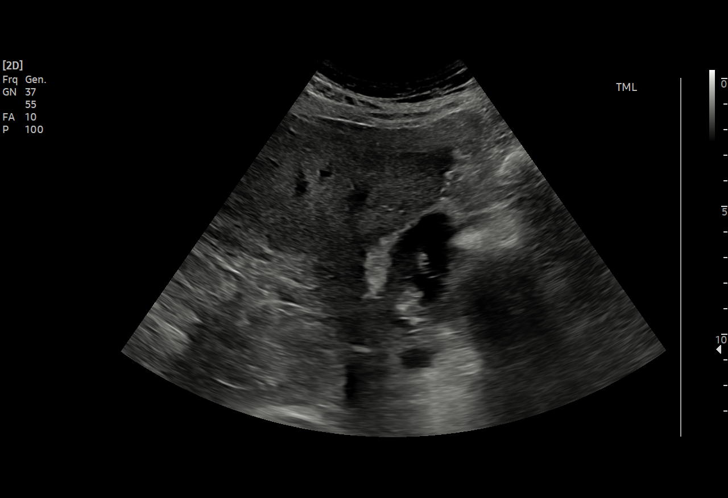
[im 21/55]
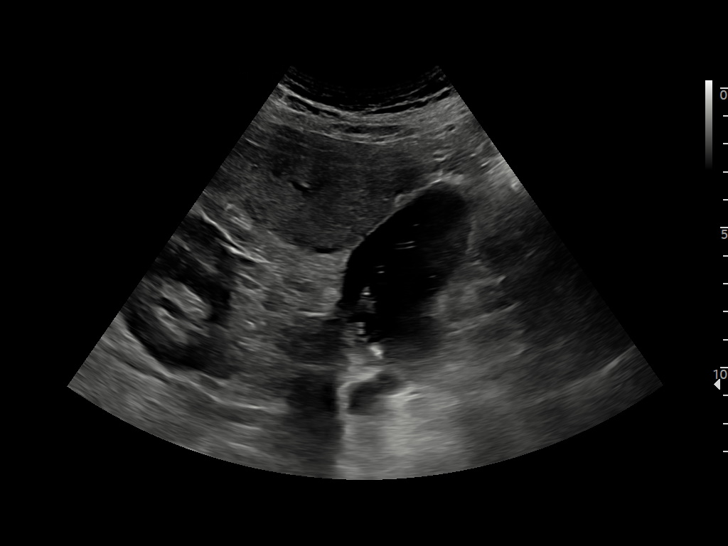
[im 23/55]
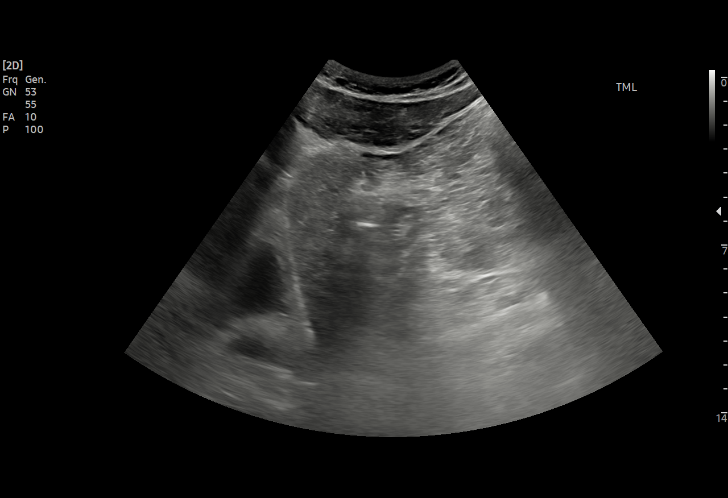
[im 28/55]
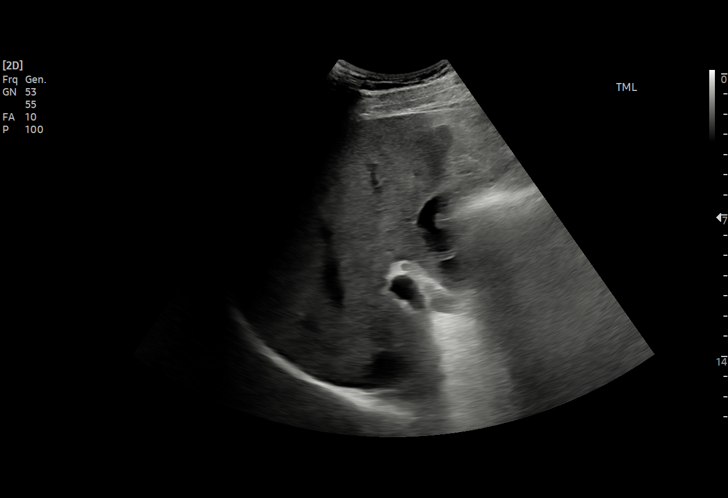
[im 32/55]
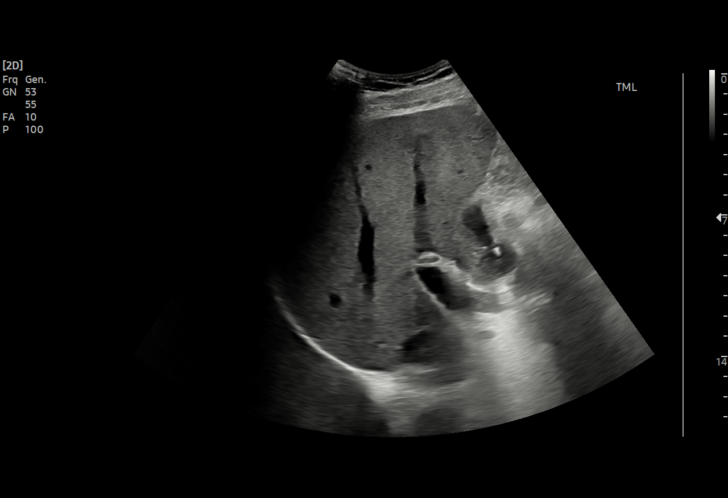
[im 34/55]
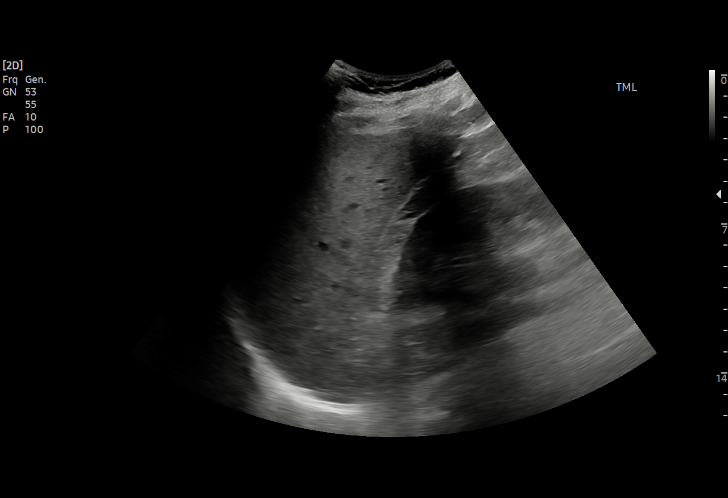
[im 39/55]
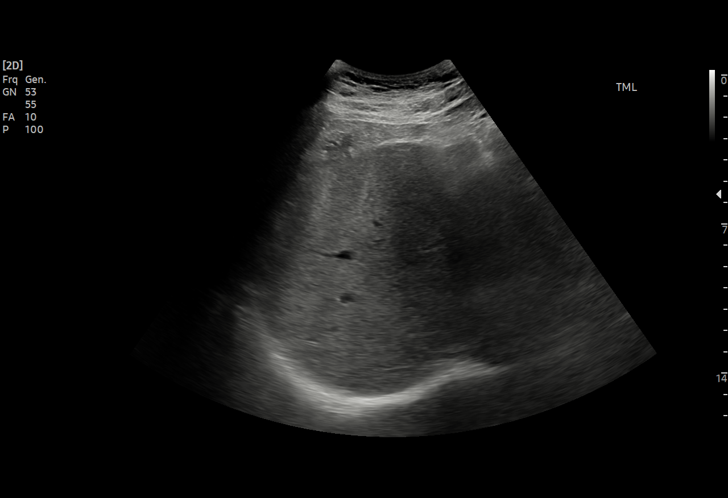
[im 43/55]
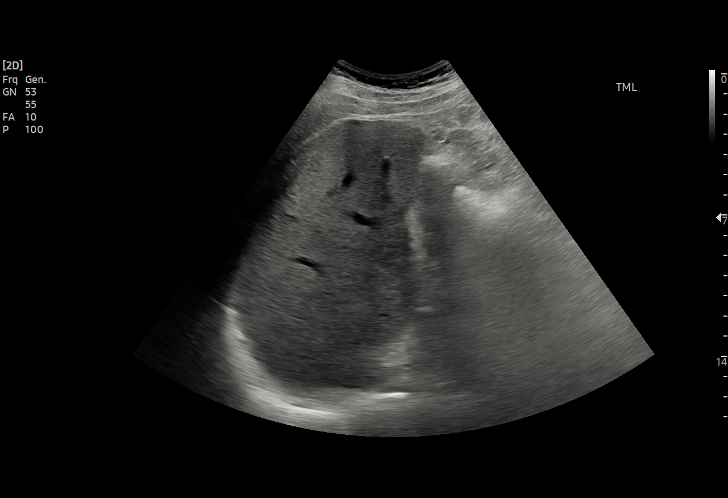
[im 46/55]
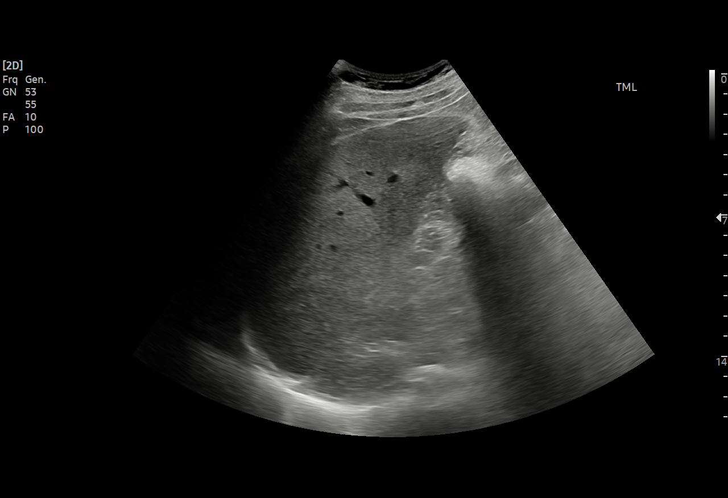
[im 50/55]
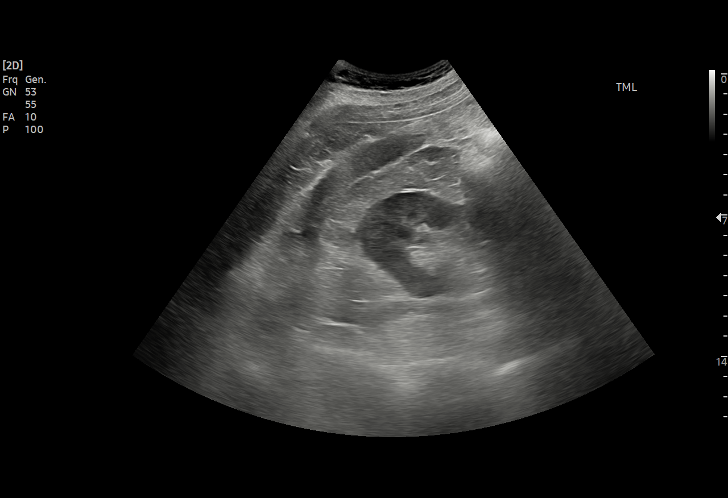
[im 55/55]
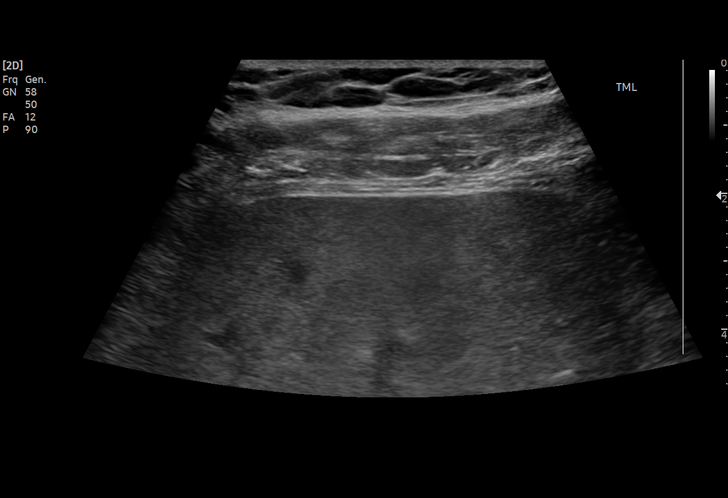

[15 of 25 positions shown; findings below may reference images not displayed]

FINDINGS: Gallbladder:

Gallbladder sludge is seen. Gallbladder wall is upper limits of
normal in thickness. No gallstones. No pericholecystic fluid. No
sonographic Murphy sign noted by sonographer.

Common bile duct:

Diameter: 1.0 cm

Liver:

No focal lesion identified. Increase in parenchymal echogenicity.
Portal vein is patent on color Doppler imaging with normal direction
of blood flow towards the liver.

Other: None.
IMPRESSION: 1. There is new dilatation of the common bile duct measuring 1 cm
concerning for distal biliary obstruction.
2. Gallbladder sludge.
3. Echogenic liver likely related to fatty infiltration.

## 2023-08-24 IMAGING — CT CT ABD-PELV W/ CM
2 of 5 series · 16 of 46 positions shown, 18 images · IV contrast (agent unspecified)
Comparison: Ultrasound abdomen 03/13/2021.

CLINICAL DATA: Acute abdominal pain.  Right upper quadrant pain.

EXAM:
CT ABDOMEN AND PELVIS WITH CONTRAST
TECHNIQUE: Multidetector CT imaging of the abdomen and pelvis was performed
using the standard protocol following bolus administration of
intravenous contrast.

[Series 2: axial st · axial · 0.87mm/px · z∈[-533,-73]mm · 13 of 108 slices shown, 15 images]
[im 8/108  soft-tissue]
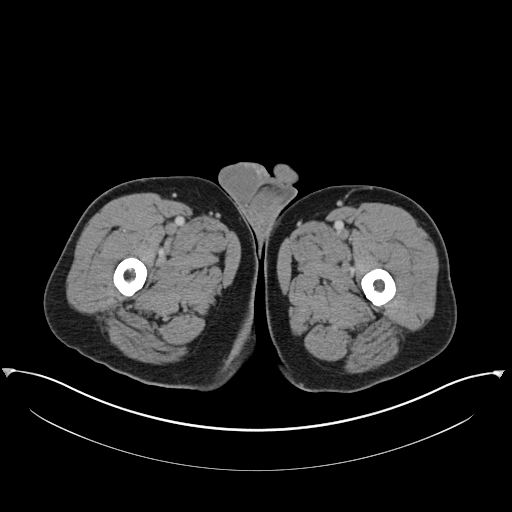
[im 8/108  bone]
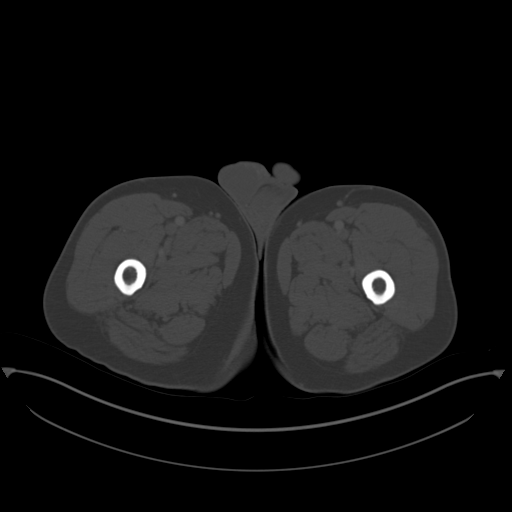
[im 15/108  soft-tissue]
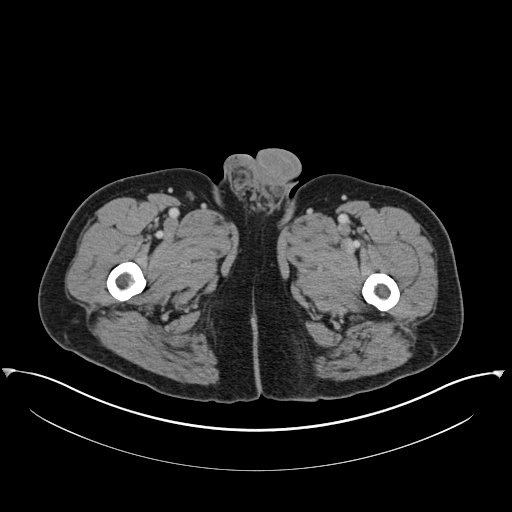
[im 22/108  soft-tissue]
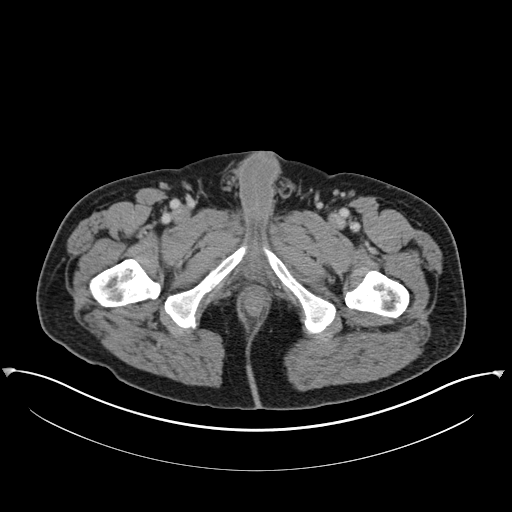
[im 29/108  soft-tissue]
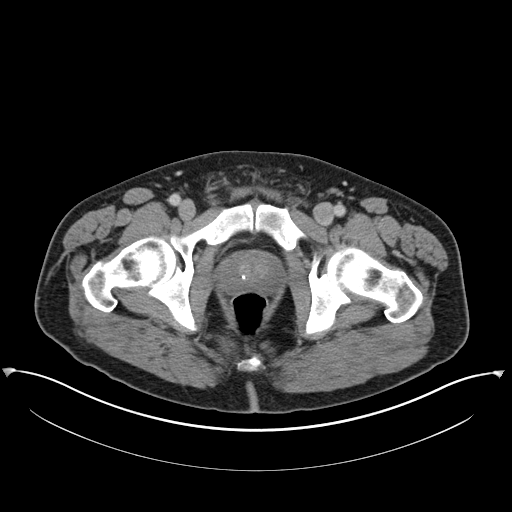
[im 36/108  soft-tissue]
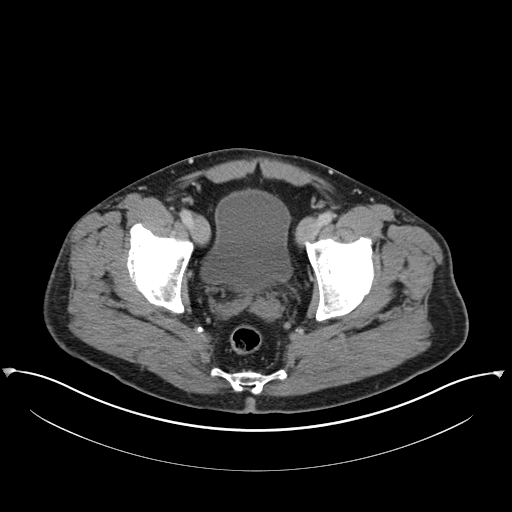
[im 43/108  soft-tissue]
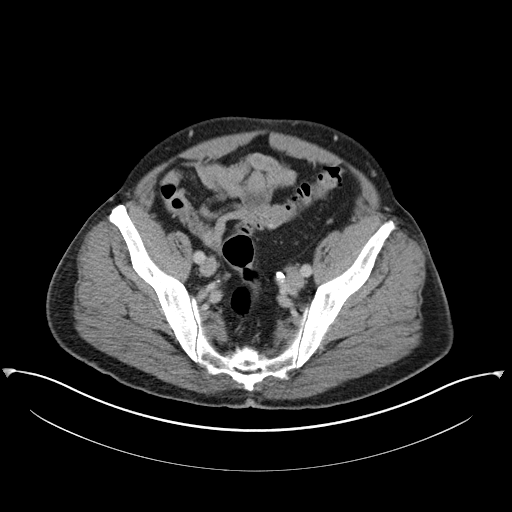
[im 58/108  soft-tissue]
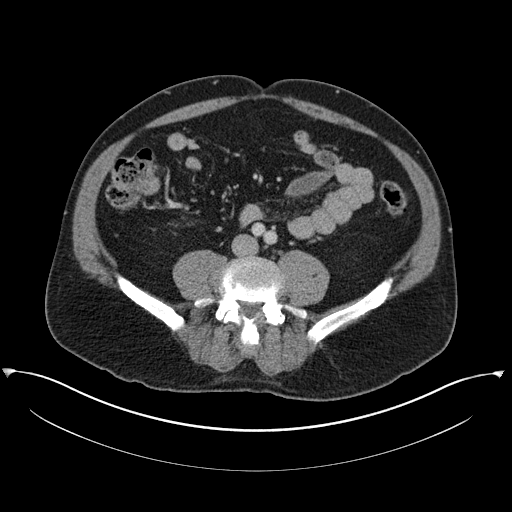
[im 65/108  soft-tissue]
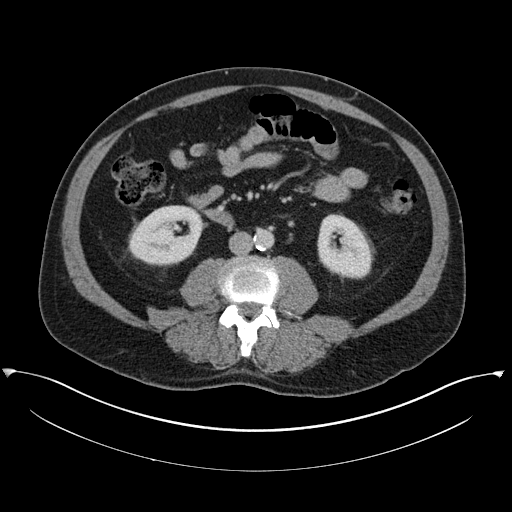
[im 72/108  soft-tissue]
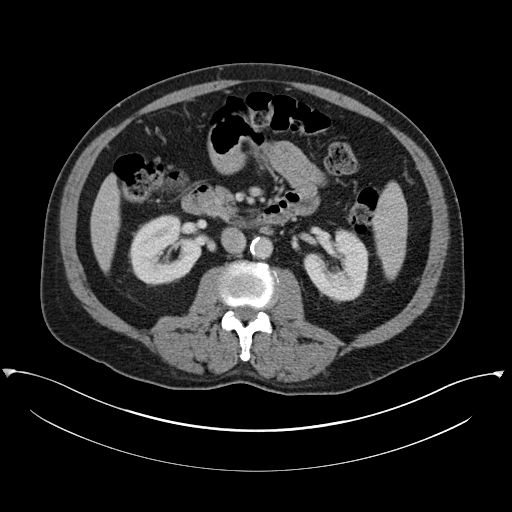
[im 72/108  bone]
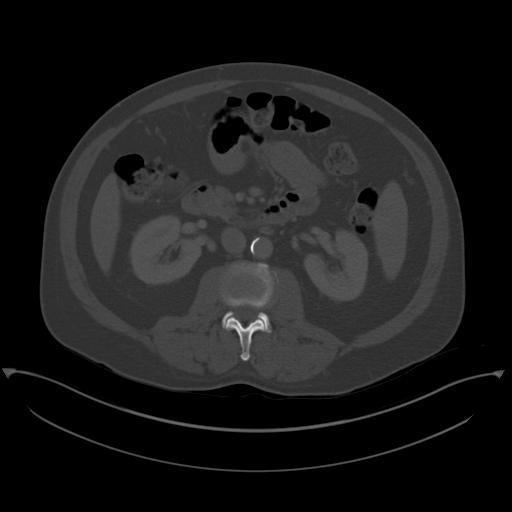
[im 79/108  soft-tissue]
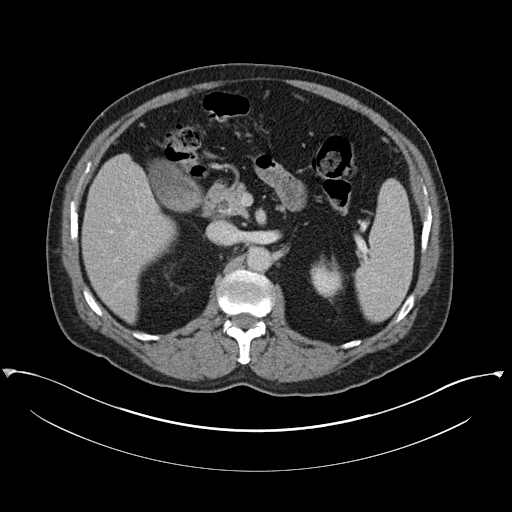
[im 86/108  soft-tissue]
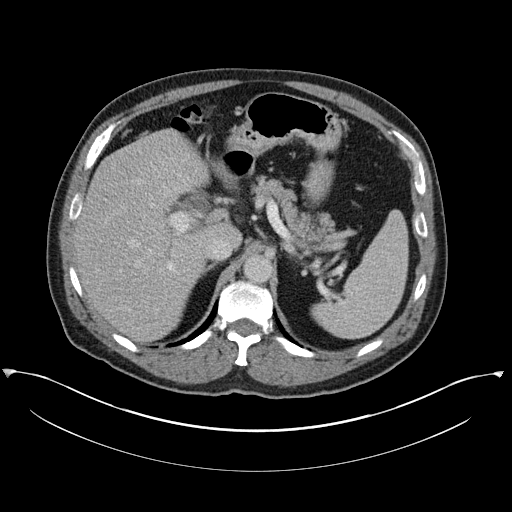
[im 93/108  soft-tissue]
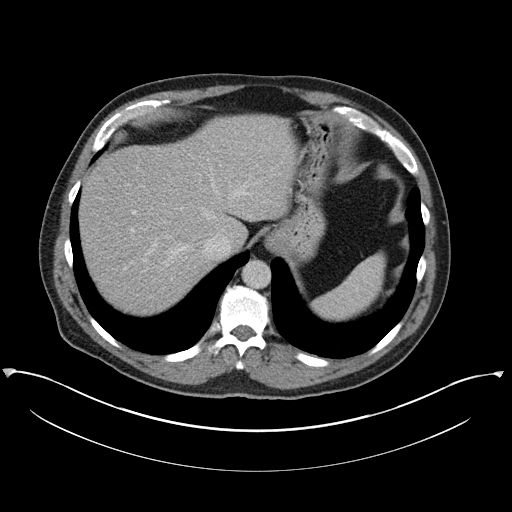
[im 100/108  soft-tissue]
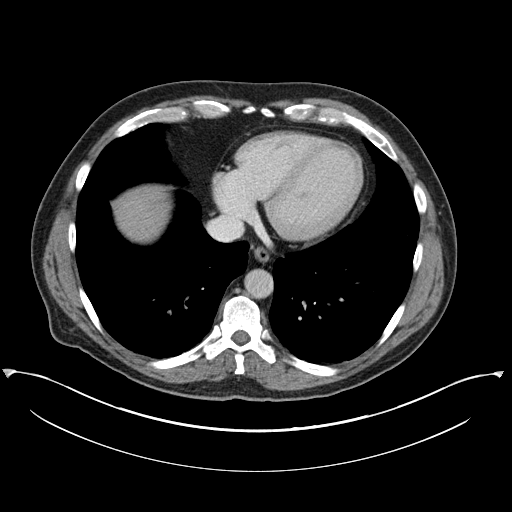

[Series 4: coronal st · coronal · 0.91mm/px · 3 of 146 slices shown]
[im 49/146  soft-tissue]
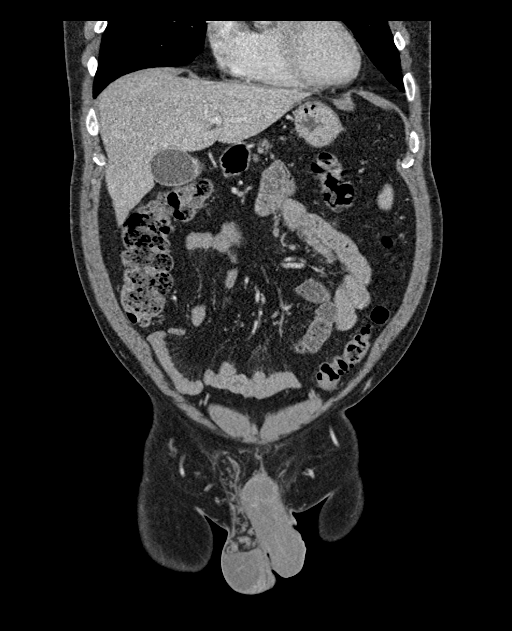
[im 65/146  soft-tissue]
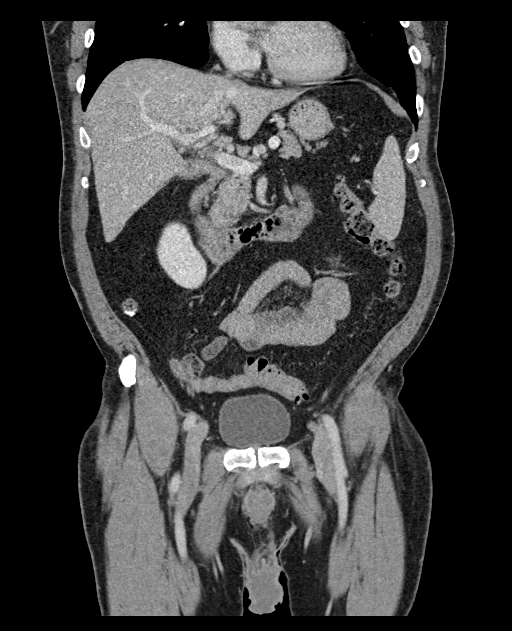
[im 81/146  soft-tissue]
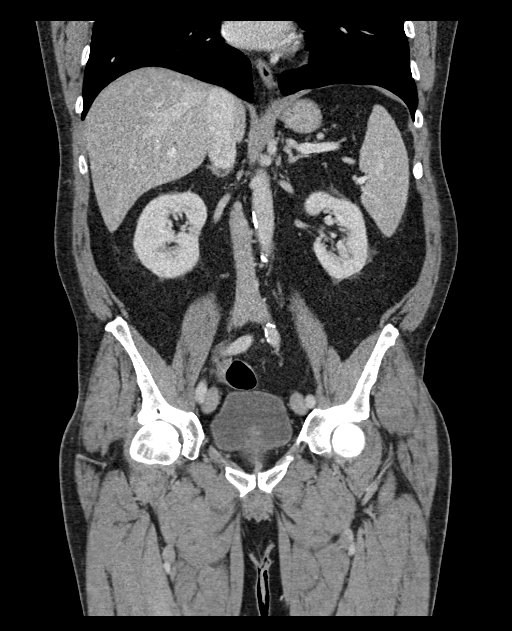

[16 of 46 positions shown; findings below may reference images not displayed]

RADIATION DOSE REDUCTION: This exam was performed according to the
departmental dose-optimization program which includes automated
exposure control, adjustment of the mA and/or kV according to
patient size and/or use of iterative reconstruction technique.

CONTRAST:  100mL OMNIPAQUE IOHEXOL 350 MG/ML SOLN
FINDINGS: Lower chest: No acute abnormality.

Hepatobiliary: Gallbladder sludge is present. There are 2 small
echogenic foci within the distal common bile duct at the level of
the pancreatic head suspicious for choledocholithiasis. Common bile
duct measures 1 cm as seen on prior ultrasound. No focal liver
lesions are identified. No pericholecystic inflammation.

Pancreas: Unremarkable. No pancreatic ductal dilatation or
surrounding inflammatory changes.

Spleen: Normal in size without focal abnormality.

Adrenals/Urinary Tract: Adrenal glands are unremarkable. Kidneys are
normal, without renal calculi, focal lesion, or hydronephrosis.
Bladder is unremarkable.

Stomach/Bowel: Stomach is within normal limits. Appendix is
surgically absent. No evidence of bowel wall thickening, distention,
or inflammatory changes. There is colonic diverticulosis without
evidence for acute diverticulitis.

Vascular/Lymphatic: Aortic atherosclerosis. No enlarged abdominal or
pelvic lymph nodes.

Reproductive: Prostate gland is enlarged.

Other: No abdominal wall hernia or abnormality. No abdominopelvic
ascites.

Musculoskeletal: No acute or significant osseous findings.
IMPRESSION: 1. Dilated common bile duct with findings suspicious for
choledocholithiasis. Other common bile duct lesions not excluded.
This can be further evaluated with ERCP or MRCP as clinically
warranted.
2. Gallbladder sludge.
3. Colonic diverticulosis.
4.  Aortic Atherosclerosis ([id number]).
5. Prostatomegaly.

## 2023-08-25 IMAGING — RF DG ERCP WO/W SPHINCTEROTOMY
1 series · 8 of 8 positions shown · non-contrast
Comparison: CT abdomen pelvis-03/13/2021

CLINICAL DATA: ERCP with stone removal.  Jaundice.

EXAM:
ERCP
TECHNIQUE: Multiple spot images obtained with the fluoroscopic device and
submitted for interpretation post-procedure.
FLUOROSCOPY TIME:  3 minutes, 48 seconds

[Series 1: run · 8 of 8 slices shown]
[im 1/8]
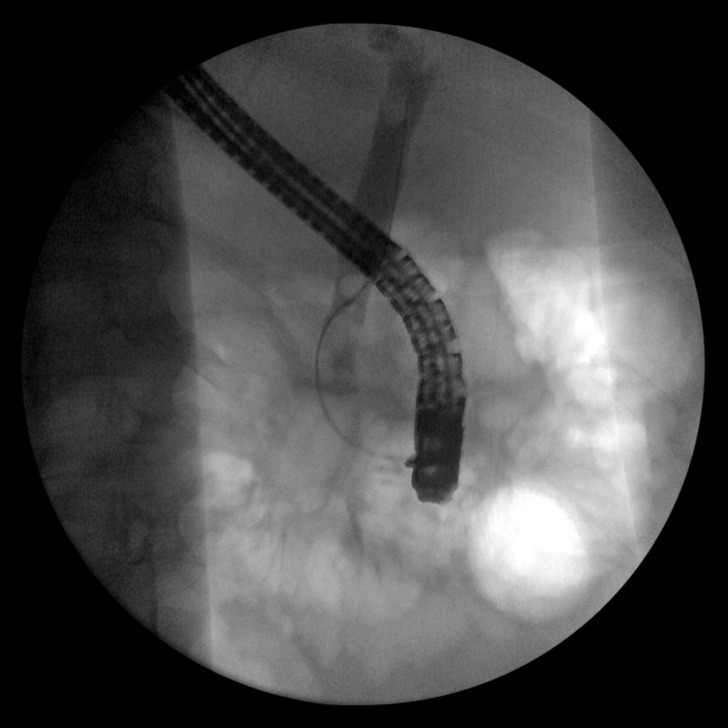
[im 2/8]
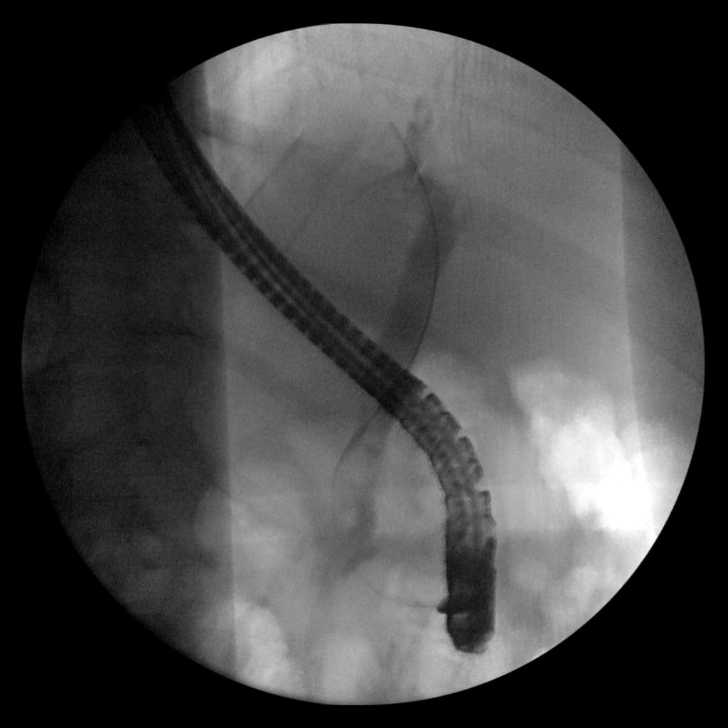
[im 3/8]
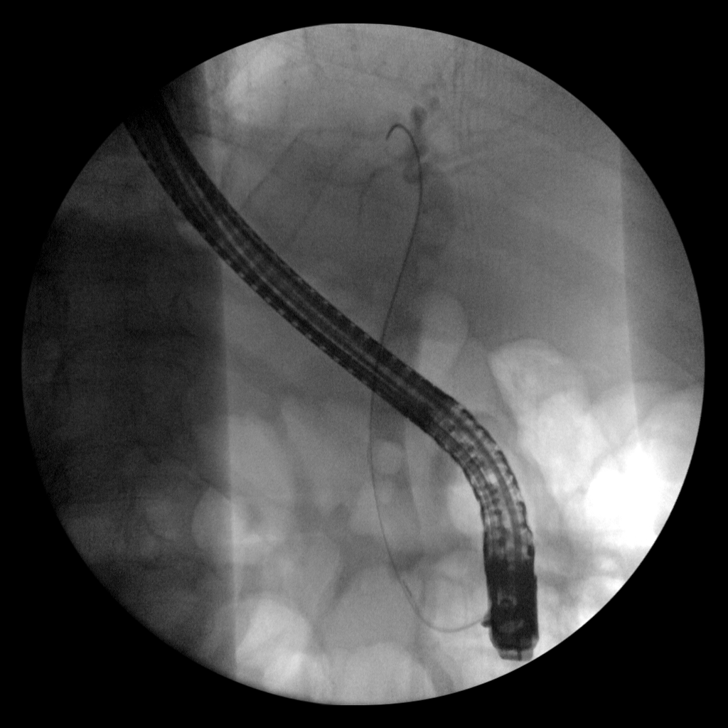
[im 4/8]
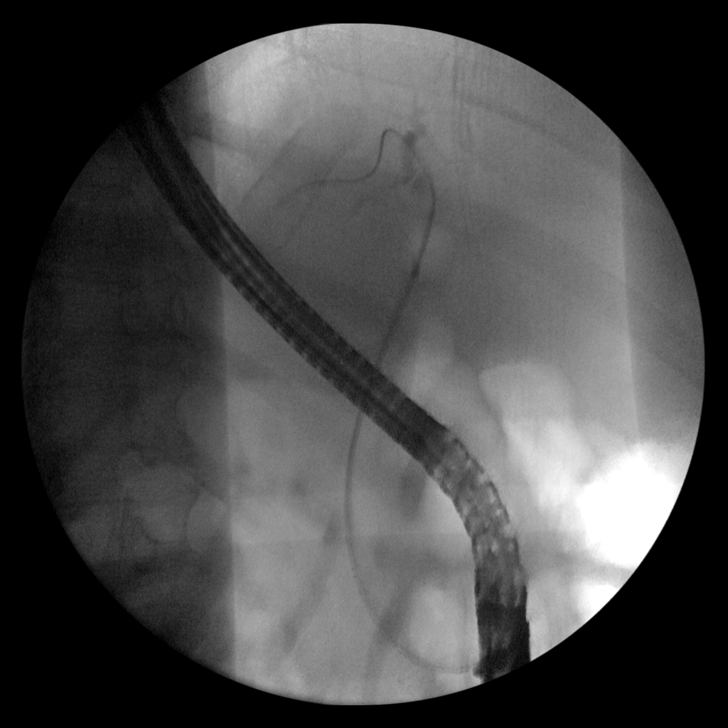
[im 5/8]
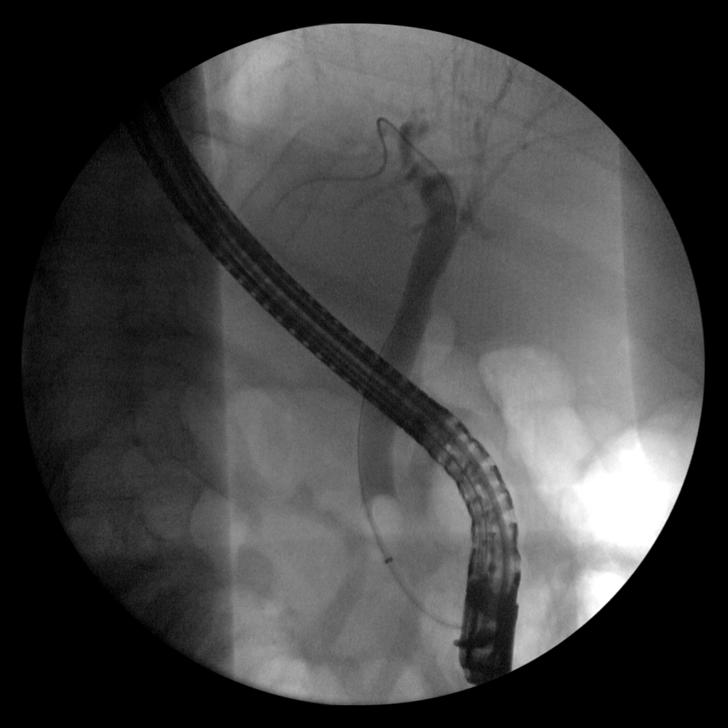
[im 6/8]
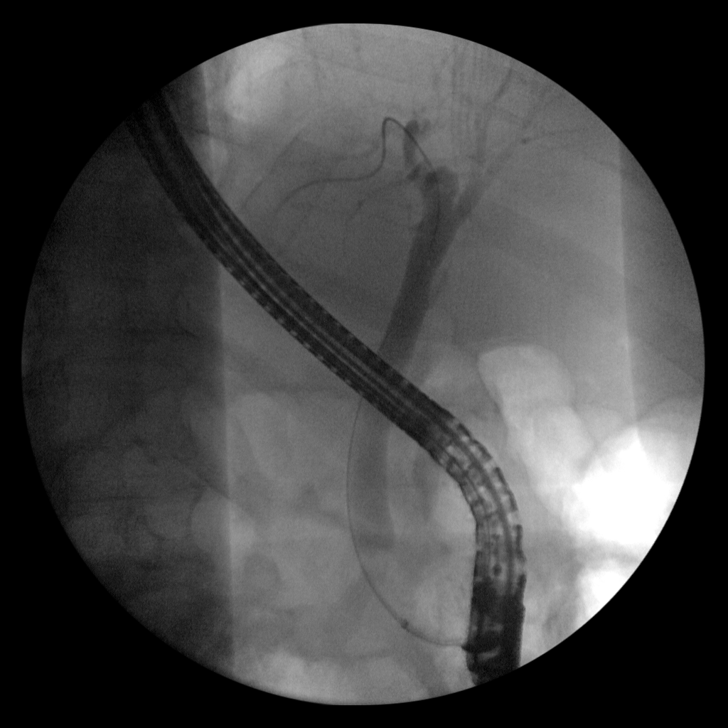
[im 7/8]
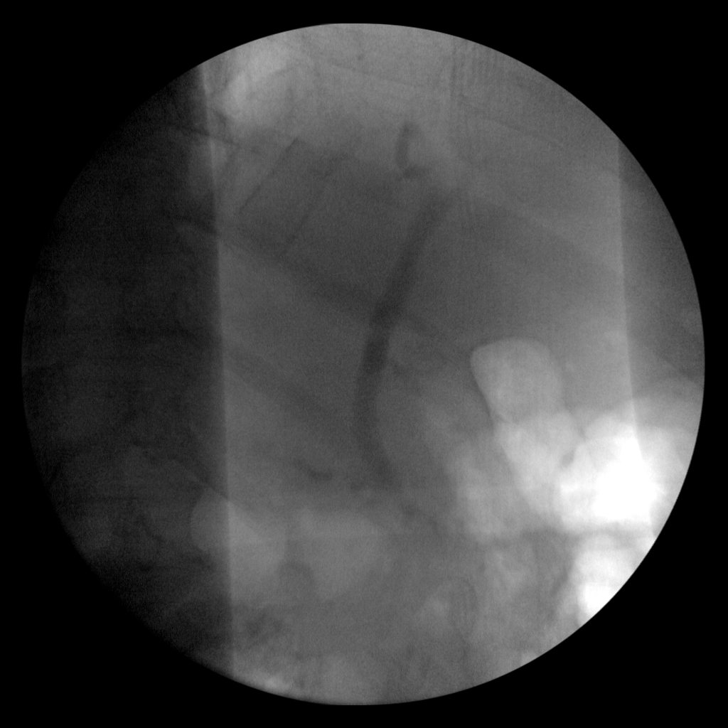
[im 8/8]
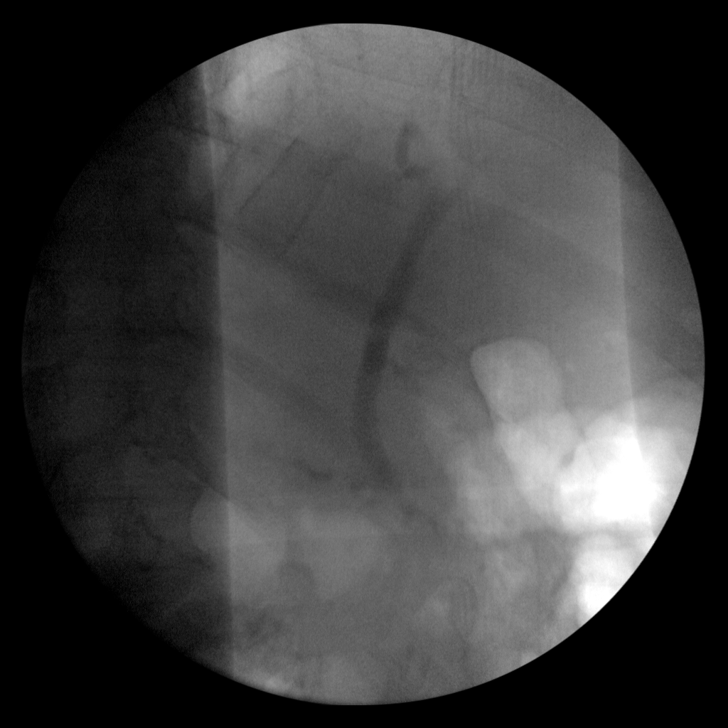

[8 of 8 positions shown; findings below may reference images not displayed]

FINDINGS: Seven spot intraoperative fluoroscopic images of the right upper
abdominal quadrant during ERCP are provided for review

Initial image demonstrates an ERCP probe overlying the right upper
abdominal quadrant. There is selective cannulation and opacification
of the common bile duct which appears moderately dilated. There are
several persistent nonocclusive filling defects within the CBD
(representative images 1 through 3) suggestive of
choledocholithiasis as was questioned on preceding abdominal CT

Subsequent images demonstrate insufflation of a balloon within the
central aspect of the CBD with subsequent biliary sweeping and
presumed stone extraction and sphincterotomy.
IMPRESSION: ERCP with findings suggestive of choledocholithiasis with subsequent
biliary sweeping, stone extraction and presumed sphincterotomy.

These images were submitted for radiologic interpretation only.
Please see the procedural report for the amount of contrast and the
fluoroscopy time utilized.

## 2023-08-27 DIAGNOSIS — F418 Other specified anxiety disorders: Secondary | ICD-10-CM | POA: Diagnosis not present

## 2023-09-24 DIAGNOSIS — F418 Other specified anxiety disorders: Secondary | ICD-10-CM | POA: Diagnosis not present

## 2023-10-07 DIAGNOSIS — F418 Other specified anxiety disorders: Secondary | ICD-10-CM | POA: Diagnosis not present

## 2023-10-22 DIAGNOSIS — F418 Other specified anxiety disorders: Secondary | ICD-10-CM | POA: Diagnosis not present
